# Patient Record
Sex: Male | Born: 1975
Health system: Southern US, Community
[De-identification: ages and names within clinical notes are randomized; demographics above are authoritative.]

## PROBLEM LIST (undated history)

## (undated) DIAGNOSIS — I10 Essential (primary) hypertension: Secondary | ICD-10-CM

## (undated) DIAGNOSIS — Z21 Asymptomatic human immunodeficiency virus [HIV] infection status: Secondary | ICD-10-CM

## (undated) DIAGNOSIS — B20 Human immunodeficiency virus [HIV] disease: Secondary | ICD-10-CM

## (undated) HISTORY — DX: Essential (primary) hypertension: I10

---

## 2017-04-12 ENCOUNTER — Encounter: Payer: Self-pay | Admitting: Podiatry

## 2017-04-12 ENCOUNTER — Ambulatory Visit (INDEPENDENT_AMBULATORY_CARE_PROVIDER_SITE_OTHER): Payer: BLUE CROSS/BLUE SHIELD | Admitting: Podiatry

## 2017-04-12 VITALS — BP 99/72 | HR 56 | Resp 16 | Ht 67.0 in | Wt 140.0 lb

## 2017-04-12 DIAGNOSIS — Q828 Other specified congenital malformations of skin: Secondary | ICD-10-CM

## 2017-04-12 DIAGNOSIS — L603 Nail dystrophy: Secondary | ICD-10-CM

## 2017-04-12 DIAGNOSIS — M2141 Flat foot [pes planus] (acquired), right foot: Secondary | ICD-10-CM

## 2017-04-12 DIAGNOSIS — M2142 Flat foot [pes planus] (acquired), left foot: Secondary | ICD-10-CM | POA: Diagnosis not present

## 2017-04-12 NOTE — Progress Notes (Signed)
   Subjective:    Patient ID: Charles Dalton, male    DOB: 11/23/1976, 41 y.o.   MRN: [id number]  HPI: He presents today chief complaint of pain to the left foot. He states that I think to have a wart beneath my fifth toe. He states his been going on now for about 10 years intrinsic down with a razor.  Review of Systems  HENT: Positive for sinus pain.   Eyes: Positive for itching.  Musculoskeletal: Positive for myalgias.  Neurological: Positive for numbness.  All other systems reviewed and are negative.      Objective:   Physical Exam: Vital signs are stable he is alert and oriented 3 pulses are palpable. He works at [employer]. He demonstrates mild pes planus set fifth metatarsal reactive hyperkeratotic lesion flexible pes planus radiographs demonstrate no major osseous abnormalities. No fractures are identified. Cutaneous evaluation only reactive hyperkeratosis no lesions noted. He does demonstrate reactive hyperkeratosis of fifth bilateral not just the left that he was complaining about. No signs of wart.        Assessment & Plan:  Plantar flexed fifth metatarsals with plantar fasciitis.  Plan: Debridement of reactive hyperkeratosis and he was scanned for several orthotics.

## 2017-05-05 ENCOUNTER — Ambulatory Visit: Payer: BLUE CROSS/BLUE SHIELD | Admitting: Podiatry

## 2020-12-19 ENCOUNTER — Ambulatory Visit: Payer: 59

## 2020-12-19 ENCOUNTER — Other Ambulatory Visit: Payer: Self-pay

## 2020-12-19 ENCOUNTER — Other Ambulatory Visit: Payer: 59

## 2020-12-19 ENCOUNTER — Other Ambulatory Visit (HOSPITAL_COMMUNITY)
Admission: RE | Admit: 2020-12-19 | Discharge: 2020-12-19 | Disposition: A | Discharge status: Home / Self Care | Payer: 59 | Source: Ambulatory Visit | Attending: Infectious Diseases | Admitting: Infectious Diseases

## 2020-12-19 DIAGNOSIS — Z113 Encounter for screening for infections with a predominantly sexual mode of transmission: Secondary | ICD-10-CM | POA: Diagnosis present

## 2020-12-19 DIAGNOSIS — Z79899 Other long term (current) drug therapy: Secondary | ICD-10-CM

## 2020-12-19 DIAGNOSIS — B20 Human immunodeficiency virus [HIV] disease: Secondary | ICD-10-CM

## 2020-12-19 NOTE — Addendum Note (Signed)
Addended by: Harley Alto on: 12/19/2020 12:36 PM   Modules accepted: Orders

## 2020-12-19 NOTE — Addendum Note (Signed)
Addended by: Linna Hoff D on: 12/19/2020 10:22 AM   Modules accepted: Orders

## 2020-12-20 LAB — URINALYSIS
Bilirubin Urine: NEGATIVE
Glucose, UA: NEGATIVE
Hgb urine dipstick: NEGATIVE
Ketones, ur: NEGATIVE
Leukocytes,Ua: NEGATIVE
Nitrite: NEGATIVE
Specific Gravity, Urine: 1.011 (ref 1.001–1.03)
pH: 5.5 (ref 5.0–8.0)

## 2020-12-22 LAB — URINE CYTOLOGY ANCILLARY ONLY
Chlamydia: NEGATIVE
Comment: NEGATIVE
Comment: NORMAL
Neisseria Gonorrhea: NEGATIVE

## 2020-12-26 LAB — CBC WITH DIFFERENTIAL/PLATELET
Absolute Monocytes: 532 cells/uL (ref 200–950)
Basophils Absolute: 42 cells/uL (ref 0–200)
Basophils Relative: 0.6 %
Eosinophils Absolute: 7 cells/uL — ABNORMAL LOW (ref 15–500)
Eosinophils Relative: 0.1 %
HCT: 38 % — ABNORMAL LOW (ref 38.5–50.0)
Hemoglobin: 12.9 g/dL — ABNORMAL LOW (ref 13.2–17.1)
Lymphs Abs: 2569 cells/uL (ref 850–3900)
MCH: 34.9 pg — ABNORMAL HIGH (ref 27.0–33.0)
MCHC: 33.9 g/dL (ref 32.0–36.0)
MCV: 102.7 fL — ABNORMAL HIGH (ref 80.0–100.0)
MPV: 11.9 fL (ref 7.5–12.5)
Monocytes Relative: 7.6 %
Neutro Abs: 3850 cells/uL (ref 1500–7800)
Neutrophils Relative %: 55 %
Platelets: 196 10*3/uL (ref 140–400)
RBC: 3.7 10*6/uL — ABNORMAL LOW (ref 4.20–5.80)
RDW: 12.2 % (ref 11.0–15.0)
Total Lymphocyte: 36.7 %
WBC: 7 10*3/uL (ref 3.8–10.8)

## 2020-12-26 LAB — HEPATITIS B CORE ANTIBODY, TOTAL: Hep B Core Total Ab: NONREACTIVE

## 2020-12-26 LAB — HIV-1/2 AB - DIFFERENTIATION
HIV-1 antibody: POSITIVE — AB
HIV-2 Ab: NEGATIVE

## 2020-12-26 LAB — HEPATITIS B SURFACE ANTIBODY,QUALITATIVE: Hep B S Ab: NONREACTIVE

## 2020-12-26 LAB — COMPLETE METABOLIC PANEL WITH GFR
AG Ratio: 1.8 (calc) (ref 1.0–2.5)
ALT: 61 U/L — ABNORMAL HIGH (ref 9–46)
AST: 74 U/L — ABNORMAL HIGH (ref 10–40)
Albumin: 4.8 g/dL (ref 3.6–5.1)
Alkaline phosphatase (APISO): 106 U/L (ref 36–130)
BUN: 11 mg/dL (ref 7–25)
CO2: 23 mmol/L (ref 20–32)
Calcium: 9.4 mg/dL (ref 8.6–10.3)
Chloride: 104 mmol/L (ref 98–110)
Creat: 0.92 mg/dL (ref 0.60–1.35)
GFR, Est African American: 117 mL/min/{1.73_m2} (ref >=60)
GFR, Est Non African American: 101 mL/min/{1.73_m2} (ref >=60)
Globulin: 2.6 g/dL (calc) (ref 1.9–3.7)
Glucose, Bld: 96 mg/dL (ref 65–99)
Potassium: 3.3 mmol/L — ABNORMAL LOW (ref 3.5–5.3)
Sodium: 142 mmol/L (ref 135–146)
Total Bilirubin: 0.5 mg/dL (ref 0.2–1.2)
Total Protein: 7.4 g/dL (ref 6.1–8.1)

## 2020-12-26 LAB — QUANTIFERON-TB GOLD PLUS
Mitogen-NIL: >10 IU/mL
NIL: 0.07 IU/mL
QuantiFERON-TB Gold Plus: NEGATIVE
TB1-NIL: <0 IU/mL
TB2-NIL: <0 IU/mL

## 2020-12-26 LAB — T-HELPER CELLS (CD4) COUNT (NOT AT ARMC)
Absolute CD4: 1332 cells/uL (ref 490–1740)
CD4 T Helper %: 44 % (ref 30–61)
Total lymphocyte count: 3015 cells/uL (ref 850–3900)

## 2020-12-26 LAB — HIV-1 RNA ULTRAQUANT REFLEX TO GENTYP+
HIV 1 RNA Quant: <20 copies/mL
HIV-1 RNA Quant, Log: <1.3 Log copies/mL

## 2020-12-26 LAB — HEPATITIS A ANTIBODY, TOTAL: Hepatitis A AB,Total: NONREACTIVE

## 2020-12-26 LAB — LIPID PANEL
Cholesterol: 150 mg/dL (ref <200)
HDL: 89 mg/dL (ref >=40)
LDL Cholesterol (Calc): 41 mg/dL (calc)
Non-HDL Cholesterol (Calc): 61 mg/dL (calc) (ref <130)
Total CHOL/HDL Ratio: 1.7 (calc) (ref <5.0)
Triglycerides: 112 mg/dL (ref <150)

## 2020-12-26 LAB — RPR: RPR Ser Ql: NONREACTIVE

## 2020-12-26 LAB — HEPATITIS C ANTIBODY
Hepatitis C Ab: NONREACTIVE
SIGNAL TO CUT-OFF: 0.03 (ref <1.00)

## 2020-12-26 LAB — HIV ANTIBODY (ROUTINE TESTING W REFLEX): HIV 1&2 Ab, 4th Generation: REACTIVE — AB

## 2020-12-26 LAB — HEPATITIS B SURFACE ANTIGEN: Hepatitis B Surface Ag: NONREACTIVE

## 2020-12-26 LAB — HLA B*5701: HLA-B*5701 w/rflx HLA-B High: NEGATIVE

## 2021-01-02 ENCOUNTER — Other Ambulatory Visit: Payer: Self-pay | Admitting: Infectious Diseases

## 2021-01-02 ENCOUNTER — Ambulatory Visit (INDEPENDENT_AMBULATORY_CARE_PROVIDER_SITE_OTHER): Payer: 59 | Admitting: Infectious Diseases

## 2021-01-02 ENCOUNTER — Telehealth: Payer: Self-pay

## 2021-01-02 ENCOUNTER — Encounter: Payer: Self-pay | Admitting: Infectious Diseases

## 2021-01-02 ENCOUNTER — Other Ambulatory Visit: Payer: Self-pay

## 2021-01-02 VITALS — BP 161/102 | HR 87 | Temp 99.0°F | Wt 136.2 lb

## 2021-01-02 DIAGNOSIS — Z23 Encounter for immunization: Secondary | ICD-10-CM | POA: Diagnosis not present

## 2021-01-02 DIAGNOSIS — Z113 Encounter for screening for infections with a predominantly sexual mode of transmission: Secondary | ICD-10-CM | POA: Diagnosis not present

## 2021-01-02 DIAGNOSIS — Z1211 Encounter for screening for malignant neoplasm of colon: Secondary | ICD-10-CM | POA: Insufficient documentation

## 2021-01-02 DIAGNOSIS — Z7289 Other problems related to lifestyle: Secondary | ICD-10-CM | POA: Diagnosis not present

## 2021-01-02 DIAGNOSIS — Z789 Other specified health status: Secondary | ICD-10-CM

## 2021-01-02 DIAGNOSIS — B20 Human immunodeficiency virus [HIV] disease: Secondary | ICD-10-CM | POA: Diagnosis not present

## 2021-01-02 DIAGNOSIS — F17200 Nicotine dependence, unspecified, uncomplicated: Secondary | ICD-10-CM | POA: Insufficient documentation

## 2021-01-02 MED ORDER — BICTEGRAVIR-EMTRICITAB-TENOFOV 50-200-25 MG PO TABS: 1.0000 | ORAL_TABLET | Freq: Every day | ORAL | 5 refills | Status: DC

## 2021-01-02 MED FILL — BIKTARVY 50-200-25 MG TABS: 50-200-25 | 30 days supply | Qty: 30 | Fill #0

## 2021-01-02 NOTE — Assessment & Plan Note (Signed)
Counseled

## 2021-01-02 NOTE — Telephone Encounter (Signed)
[facility] Patient Product/process development scientist completed.    The patient is insured through [facility] and has a $250.00 copay.  Patient will need a Co-Pay Coupon Card.  We will continue to follow to see if copay assistance is needed.  Clearance Coots, CPhT Specialty Pharmacy Patient [facility] for Infectious Disease Phone: [phone] Fax:  [phone]

## 2021-01-02 NOTE — Assessment & Plan Note (Signed)
counseled

## 2021-01-02 NOTE — Assessment & Plan Note (Signed)
Recent Urine GC and RPR negative  

## 2021-01-02 NOTE — Progress Notes (Signed)
[address], [place], [place], [address]                                                                  Phn. [phone]; Fax: [phone]                                                                             Date: 01/02/21  Reason for Visit: HIV Transferring Care    HPI: Charles Dalton is a 45 y.o.old male with a history of HIV who is transferring care to [facility].  Patient is reluctant to provide his past medical history of HIV.  He tells me that he just found out he had HIV. He was referred to me from [facility] department with no other information except positive result of HIV 1 in 09/12/2010.  He also tells me that he has never been treated for HIV and never seen a provider. On further probing, he told me he [facility] but he has not ever taken any antiretroviral treatment.  I do not have his outside facility records but it seems he was seen at [facility] by Dr. Currie Paris in 02/15/2014 per HD notes.  I have asked my staff to request medical records from there.  He denies having any other medical problems to me.  Denies any history of sexually transmitted diseases like syphilis, gonorrhea and Chlamydia  Smokes a pack of cigarettes a day, 5 times a week drinks liquor. Smokes weed everyday  Work in an Research scientist (life sciences) part store Lives in an apartment by himself  Denies being currently sexually active currently but had male sexual partners in the past.  Has traveled to [place] and [place]. He says he has traveled a lot of states in the [place].   He says he has got 2 doses of Covid vaccine and is willing to get the Covid booster and flu vaccine.  He is interested to start treatment for HIV.  He has on and off loose  Stools going on for the last 3  months which he describes as 2-3 times a day, sometimes watery and sometimes solid which started after having a barbecue. Denies associated fever, chills, N/V and abdominal pain. Denies recent travel, sick contacts, new medications.  Appetite is good. No changes in weight. No other complaints   ROS: Denies dysphagia, odynophagia, cough, fever, nausea, vomiting, diarrhea, constipation, weight loss, chills, night sweats, recent hospitalizations, rashes, joint complaints, shortness of breath, headaches, chest pain, abdominal pain, dysuria .  Current Outpatient Medications on File Prior to Visit  Medication Sig Dispense Refill  .  CHANTIX STARTING MONTH PAK 0.5 MG X 11 & 1 MG X 42 tablet See admin instructions.  3  . naproxen (NAPROSYN) 500 MG tablet TAKE 1 TAB W/ BREAKFAST AND DINNER X 2 WEEKS  0   No current facility-administered medications on file prior to visit.     No Known Allergies  PMH: No PSH: No   Social History   Socioeconomic History  . Marital status: Single    Spouse name: Not on file  . Number of children: Not on file  . Years of education: Not on file  . Highest education level: Not on file  Occupational History  . Not on file  Tobacco Use  . Smoking status: Current Every Day Smoker  . Smokeless tobacco: Never Used  Substance and Sexual Activity  . Alcohol use: Not on file  . Drug use: Not on file  . Sexual activity: Not on file  Other Topics Concern  . Not on file  Social History Narrative  . Not on file   Social Determinants of Health   Financial Resource Strain: Not on file  Food Insecurity: Not on file  Transportation Needs: Not on file  Physical Activity: Not on file  Stress: Not on file  Social Connections: Not on file  Intimate Partner Violence: Not on file    Vitals  BP (!) 161/102   Pulse 87   Temp 99 F (37.2 C) (Oral)   Wt 136 lb 3.2 oz (61.8 kg)   BMI 21.33 kg/m   Examination  Gen: Alert and oriented x 3, no acute distress HEENT:  [place]/AT, PERL, no scleral icterus, no pale conjunctivae, hearing normal, oral mucosa moist, NO ORAL CANDIDIASIS Neck: Supple, no lymphadenopathy Cardio: Regular rate and rhythm; +S1 and S2; no murmurs, gallops, or rubs Resp: CTAB; no wheezes, rhonchi, or rales GI: Soft, nontender, nondistended, bowel sounds present GU: Musc: Extremities: No cyanosis, clubbing, or edema; +2 PT and DP pulses Skin: DRY SKIN  Neuro: No focal deficits Psych: Calm, cooperative    Lab Results HIV 1 RNA Quant (copies/mL)  Date Value  12/19/2020 <20 NOT DETECTED   No results found for: HIV1GENOSEQ Lab Results  Component Value Date   WBC 7.0 12/19/2020   HGB 12.9 (L) 12/19/2020   HCT 38.0 (L) 12/19/2020   MCV 102.7 (H) 12/19/2020   PLT 196 12/19/2020    Lab Results  Component Value Date   CREATININE 0.92 12/19/2020   BUN 11 12/19/2020   NA 142 12/19/2020   K 3.3 (L) 12/19/2020   CL 104 12/19/2020   CO2 23 12/19/2020   Lab Results  Component Value Date   ALT 61 (H) 12/19/2020   AST 74 (H) 12/19/2020   BILITOT 0.5 12/19/2020    Lab Results  Component Value Date   CHOL 150 12/19/2020   TRIG 112 12/19/2020   HDL 89 12/19/2020   LDLCALC 41 12/19/2020   Lab Results  Component Value Date   HAV NON-REACTIVE 12/19/2020   Lab Results  Component Value Date   HEPBSAG NON-REACTIVE 12/19/2020   HEPBSAB NON-REACTIVE 12/19/2020   No results found for: HCVAB Lab Results  Component Value Date   CHLAMYDIAWP Negative 12/19/2020   N Negative 12/19/2020    Health Maintenance:  There is no immunization history on file for this patient.  Assessment/Plan: HIV Discussed with patient treatment options and side effects, benefits of treatment, long term outcomes.  Discussed the severity of untreated HIV including higher cancer risk, opportunistic infections, renal failure.  Discussed needing to use condoms, partner disclosure, necessary vaccines, blood monitoring.     Start Biktarvy 1 tab po daily,  discussed about instruction on how to take and side effects Fu in 2 -3 months  Will request medical records from Outside facility   Loose stools  Do not suspect OI given HIV is well controlled No recent antibiotics  Will monitor for now   Smoking/Alcohol Counseled  STD Screening Recent Urine GC and RPR negative   Immunization Discussed recommended vaccines in [facility]  Flu and COVID Booster today  I have personally spent 60 minutes involved in face-to-face and non-face-to-face activities for this patient on the day of the visit. Professional time spent includes the following activities, in addition to those noted in the documentation: Preparing to see the patient (review of tests), Obtaining and/or reviewing separately obtained history (admission/discharge record), Performing a medically appropriate examination and/or evaluation , Ordering medications/tests/procedures, referring and communicating with other health care professionals, Documenting clinical information in the EMR or other health record, Independently interpreting results (not separately reported), Communicating results to the patient/family/caregiver, Counseling and educating the patient/family/caregiver and Care coordination (not separately reported).    Patient's labs were reviewed as well as his previous records. Patients questions were addressed and answered. Safe sex counseling done.   Electronically signed by:  Odette Fraction, MD Infectious Disease Physician [facility] for Infectious Disease [address] [place], [place] [address] Phone: [phone]  Fax: [phone]

## 2021-01-02 NOTE — Assessment & Plan Note (Signed)
COVID Booster and Flu vaccine today

## 2021-01-02 NOTE — Telephone Encounter (Signed)
[facility] Patient Advocate Encounter °  °Was successful in obtaining a [employer] copay card for Biktarvy.  This copay card will make the patients copay 0.00. ° °I have spoken with the patient.   ° °The billing information is as follows and has been shared with [facility]. ° ° ° ° ° ° ° °Charles Dalton, CPhT °Specialty Pharmacy Patient Advocate °[facility] °Phone: [phone] °Fax:  [phone]  °

## 2021-01-05 NOTE — Progress Notes (Signed)
   Covid-19 Vaccination Clinic  Name:  Charles Dalton    MRN: [id number] DOB: September 23, 1976  01/05/2021  Charles Dalton was observed post Covid-19 immunization for 15 minutes without incident. He was provided with Vaccine Information Sheet and instruction to access the V-Safe system.   Charles Dalton was instructed to call 911 with any severe reactions post vaccine: Marland Kitchen Difficulty breathing  . Swelling of face and throat  . A fast heartbeat  . A bad rash all over body  . Dizziness and weakness

## 2021-01-30 ENCOUNTER — Encounter: Payer: Self-pay | Admitting: Infectious Diseases

## 2021-02-02 MED FILL — BIKTARVY 50-200-25 MG TABS: 50-200-25 | 30 days supply | Qty: 30 | Fill #1

## 2021-02-24 ENCOUNTER — Other Ambulatory Visit (HOSPITAL_COMMUNITY): Payer: Self-pay

## 2021-02-27 ENCOUNTER — Other Ambulatory Visit: Payer: Self-pay

## 2021-02-27 ENCOUNTER — Other Ambulatory Visit (HOSPITAL_COMMUNITY)
Admission: RE | Admit: 2021-02-27 | Discharge: 2021-02-27 | Disposition: A | Discharge status: Home / Self Care | Payer: 59 | Source: Ambulatory Visit | Attending: Infectious Diseases | Admitting: Infectious Diseases

## 2021-02-27 ENCOUNTER — Ambulatory Visit (INDEPENDENT_AMBULATORY_CARE_PROVIDER_SITE_OTHER): Payer: 59 | Admitting: Infectious Diseases

## 2021-02-27 ENCOUNTER — Encounter: Payer: Self-pay | Admitting: Infectious Diseases

## 2021-02-27 VITALS — BP 164/96 | HR 76 | Wt 137.0 lb

## 2021-02-27 DIAGNOSIS — R7401 Elevation of levels of liver transaminase levels: Secondary | ICD-10-CM

## 2021-02-27 DIAGNOSIS — F17200 Nicotine dependence, unspecified, uncomplicated: Secondary | ICD-10-CM

## 2021-02-27 DIAGNOSIS — B20 Human immunodeficiency virus [HIV] disease: Secondary | ICD-10-CM

## 2021-02-27 DIAGNOSIS — R197 Diarrhea, unspecified: Secondary | ICD-10-CM

## 2021-02-27 DIAGNOSIS — Z113 Encounter for screening for infections with a predominantly sexual mode of transmission: Secondary | ICD-10-CM | POA: Insufficient documentation

## 2021-02-27 MED ORDER — NICOTINE 7 MG/24HR TD PT24
7.0000 mg | MEDICATED_PATCH | Freq: Every day | TRANSDERMAL | 0 refills | Status: DC
  Filled 2021-03-25: qty 28, 28d supply, fill #0

## 2021-02-27 NOTE — Progress Notes (Signed)
[address], [place], [place], [address]                                                                  Phn. [phone]; Fax: [phone]                                                                             Date: 02/27/21  Reason for Visit: HIV follow up   HPI: Charles Dalton is a 45 y.o.old male with a history of HIV who is here for follow-up.  He is taking Biktarvy and endorses missing some doses, possibly less than 10 since last clinic visit.  Denies any side effects or barriers to adherence of treatment.  He complains of ongoing diarrhea, watery stool every time he eats and he thinks it has been going on for 6 months at least.  Denies any blood in the stool.  Denies any nausea vomiting or abdominal pain.  Denies any fever chills and sweats.  Denies any rashes or joint pain.  Denies any recent travel or new medicine intake.  No loss of appetite or loss of weight.  Denies being sexually active.  Smokes cigarettes and marijuana, counseled on quitting.  Denies alcohol or IV drug use.  He is working at [employer].  Denies any feelings of hopelessness sadness or depression.  He has a regular dentist with him whom he follows up.  He declined getting vaccinated for hepatitis A and B.  He is willing to get screened for STDs.  ROS: Denies dysphagia, odynophagia, cough, fever, nausea, vomiting,  constipation, weight loss, chills, night sweats, recent hospitalizations, rashes, joint complaints, shortness of breath, headaches, chest pain, abdominal pain, dysuria .  Current Outpatient Medications on File Prior to Visit  Medication Sig Dispense Refill  . bictegravir-emtricitabine-tenofovir AF (BIKTARVY) 50-200-25 MG TABS tablet Take 1 tablet by mouth daily. 30 tablet 5  . naproxen (NAPROSYN) 500 MG  tablet TAKE 1 TAB W/ BREAKFAST AND DINNER X 2 WEEKS  0   No current facility-administered medications on file prior to visit.                          No Known Allergies   Social History   Socioeconomic History  . Marital status: Single    Spouse name: Not on file  . Number of children: Not on file  . Years of education: Not on file  . Highest education level: Not on file  Occupational History  . Not on file  Tobacco Use  . Smoking status: Current Every Day Smoker  . Smokeless tobacco: Never Used  Substance and Sexual Activity  . Alcohol use: Not on file  . Drug use: Not on file  . Sexual activity: Not on file  Other Topics Concern  . Not on file  Social History Narrative  . Not on file   Social Determinants of Health   Financial Resource Strain: Not on file  Food Insecurity: Not on file  Transportation Needs: Not on file  Physical Activity: Not on file  Stress: Not on file  Social Connections: Not on file  Intimate Partner Violence: Not on file     Vitals  BP (!) 164/96   Pulse 76   Wt 137 lb (62.1 kg)   BMI 21.46 kg/m    Examination  Gen: Alert and oriented x 3, no acute distress HEENT: [place]/AT, PERL, EOMI, no scleral icterus, no pale conjunctivae, hearing normal, oral mucosa moist Neck: Supple, no lymphadenopathy Cardio: Regular rate and rhythm; +S1 and S2; no murmurs, gallops, or rubs Resp: CTAB; no wheezes, rhonchi, or rales GI: Soft, nontender, nondistended, bowel sounds present GU: Musc: Extremities: No cyanosis, clubbing, or edema; +2 PT and DP pulses Skin: No rashes, lesions, or ecchymoses Neuro: No focal deficits Psych: Calm, cooperative  Lab Results HIV 1 RNA Quant (copies/mL)  Date Value  12/19/2020 <20 NOT DETECTED   No results found for: HIV1GENOSEQ Lab Results  Component Value Date   WBC 7.0 12/19/2020   HGB 12.9 (L) 12/19/2020   HCT 38.0 (L) 12/19/2020   MCV 102.7 (H) 12/19/2020   PLT 196 12/19/2020    Lab Results  Component  Value Date   CREATININE 0.92 12/19/2020   BUN 11 12/19/2020   NA 142 12/19/2020   K 3.3 (L) 12/19/2020   CL 104 12/19/2020   CO2 23 12/19/2020   Lab Results  Component Value Date   ALT 61 (H) 12/19/2020   AST 74 (H) 12/19/2020   BILITOT 0.5 12/19/2020    Lab Results  Component Value Date   CHOL 150 12/19/2020   TRIG 112 12/19/2020   HDL 89 12/19/2020   LDLCALC 41 12/19/2020   Lab Results  Component Value Date   HAV NON-REACTIVE 12/19/2020   Lab Results  Component Value Date   HEPBSAG NON-REACTIVE 12/19/2020   HEPBSAB NON-REACTIVE 12/19/2020   No results found for: HCVAB Lab Results  Component Value Date   CHLAMYDIAWP Negative 12/19/2020   N Negative 12/19/2020   No results found for: GCPROBEAPT No results found for: QUANTGOLD   Health Maintenance: Immunization History  Administered Date(s) Administered  . Influenza,inj,Quad PF,6+ Mos 01/02/2021  . Moderna Sars-Covid-2 Vaccination 02/11/2020, 03/10/2020  . PFIZER Comirnaty(Gray Top)Covid-19 Tri-Sucrose Vaccine 01/02/2021   Assessment/Plan: Problem List Items Addressed This Visit      Other   HIV disease (HCC) - Primary   Relevant Orders   HIV-1 RNA ultraquant reflex to gentyp+   Smoking    Other Visit Diagnoses    Screening for venereal disease       Relevant Orders   Urine cytology ancillary only   RPR   Diarrhea, unspecified type       Relevant Orders   Gastrointestinal Pathogen Panel PCR   C. difficile GDH and Toxin A/B   Transaminitis       Relevant Orders   Hepatic function panel      HIV Continue Biktarvy  Counseled on adherence Fu in 3 months       Chronic Diarrhea Will get GI PCR panel and C diff Low suspicion for OIs given  well controlled HIV Will consider referral to GI if above work up no revealing   Transaminitis Hepatic panel today   Smoking Counseled He would like to get a refill on nicotine patch  STD Screening Urine GC and RPR  Immunization Refused Vaccines  today He will consider HepA or Hep B vaccine next time  Patient's labs were reviewed as well as his previous records. Patients questions were addressed and answered. Safe sex counseling done.  I spent approx 30  minute reviewing data/chart, and coordinating care and >50% direct face to face time providing counseling/discussing diagnostics/treatment plan with patient  Electronically signed by:  Odette Fraction, MD Infectious Disease Physician [facility] for Infectious Disease [address] [place], [place] [address] Phone: [phone]  Fax: [phone]

## 2021-02-28 ENCOUNTER — Other Ambulatory Visit (HOSPITAL_COMMUNITY): Payer: Self-pay

## 2021-03-01 LAB — URINE CYTOLOGY ANCILLARY ONLY
Chlamydia: NEGATIVE
Comment: NEGATIVE
Comment: NORMAL
Neisseria Gonorrhea: NEGATIVE

## 2021-03-02 ENCOUNTER — Other Ambulatory Visit: Payer: Self-pay | Admitting: Infectious Diseases

## 2021-03-02 ENCOUNTER — Telehealth: Payer: Self-pay

## 2021-03-02 DIAGNOSIS — R7401 Elevation of levels of liver transaminase levels: Secondary | ICD-10-CM

## 2021-03-02 NOTE — Telephone Encounter (Signed)
Spoke with patient about elevated liver enzymes. Patient reports drinking liquor ("couple of shots) 3-4 times a week. Informed him that provider would like to get an ultrasound of his liver.   Dr. Elinor Parkinson, please place referral for [place] and I'll have Claris Che follow up.   Thanks! Afomia Blackley Loyola Mast, RN

## 2021-03-02 NOTE — Telephone Encounter (Signed)
-----   Message from Odette Fraction, MD sent at 03/02/2021  8:02 AM EDT ----- Please let him know that his liver enzymes are somewhat elevated. Please check if he consumes alcohol. If yes, what is the frequency? He complained me of ongoing diarrhea in the clinic. The elevated liver enzymes may be related to his diarrhea. I would like to get an Ultrasound of his liver.

## 2021-03-02 NOTE — Telephone Encounter (Signed)
Ok, Will do!

## 2021-03-02 NOTE — Progress Notes (Signed)
Please let him know that his liver enzymes are somewhat elevated. Please check if he consumes alcohol. If yes, what is the frequency? He complained me of ongoing diarrhea in the clinic. The elevated liver enzymes may be related to his diarrhea. I would like to get an Ultrasound of his liver.

## 2021-03-04 ENCOUNTER — Other Ambulatory Visit (HOSPITAL_COMMUNITY): Payer: Self-pay

## 2021-03-04 MED FILL — Bictegravir-Emtricitabine-Tenofovir AF Tab 50-200-25 MG: ORAL | 30 days supply | Qty: 30 | Fill #0 | Status: AC

## 2021-03-05 ENCOUNTER — Other Ambulatory Visit (HOSPITAL_COMMUNITY): Payer: Self-pay

## 2021-03-16 LAB — HEPATIC FUNCTION PANEL
AG Ratio: 1.7 (calc) (ref 1.0–2.5)
ALT: 74 U/L — ABNORMAL HIGH (ref 9–46)
AST: 113 U/L — ABNORMAL HIGH (ref 10–40)
Albumin: 4.6 g/dL (ref 3.6–5.1)
Alkaline phosphatase (APISO): 95 U/L (ref 36–130)
Bilirubin, Direct: 0.1 mg/dL (ref 0.0–0.2)
Globulin: 2.7 g/dL (calc) (ref 1.9–3.7)
Indirect Bilirubin: 0.7 mg/dL (calc) (ref 0.2–1.2)
Total Bilirubin: 0.8 mg/dL (ref 0.2–1.2)
Total Protein: 7.3 g/dL (ref 6.1–8.1)

## 2021-03-16 LAB — HIV-1 RNA ULTRAQUANT REFLEX TO GENTYP+
HIV 1 RNA Quant: <20 copies/mL — AB
HIV-1 RNA Quant, Log: <1.3 Log copies/mL — AB

## 2021-03-16 LAB — RPR: RPR Ser Ql: NONREACTIVE

## 2021-03-25 ENCOUNTER — Other Ambulatory Visit (HOSPITAL_COMMUNITY): Payer: Self-pay

## 2021-03-25 MED FILL — Bictegravir-Emtricitabine-Tenofovir AF Tab 50-200-25 MG: ORAL | 30 days supply | Qty: 30 | Fill #1 | Status: AC

## 2021-04-02 ENCOUNTER — Other Ambulatory Visit (HOSPITAL_COMMUNITY): Payer: Self-pay

## 2021-05-04 ENCOUNTER — Other Ambulatory Visit (HOSPITAL_COMMUNITY): Payer: Self-pay

## 2021-05-04 MED FILL — Bictegravir-Emtricitabine-Tenofovir AF Tab 50-200-25 MG: ORAL | 30 days supply | Qty: 30 | Fill #2 | Status: AC

## 2021-05-05 ENCOUNTER — Other Ambulatory Visit (HOSPITAL_COMMUNITY): Payer: Self-pay

## 2021-05-22 ENCOUNTER — Other Ambulatory Visit: Payer: 59

## 2021-05-22 ENCOUNTER — Other Ambulatory Visit: Payer: Self-pay

## 2021-05-22 DIAGNOSIS — B20 Human immunodeficiency virus [HIV] disease: Secondary | ICD-10-CM

## 2021-05-25 LAB — CBC WITH DIFFERENTIAL/PLATELET
Absolute Monocytes: 583 cells/uL (ref 200–950)
Basophils Absolute: 39 cells/uL (ref 0–200)
Basophils Relative: 0.8 %
Eosinophils Absolute: 10 cells/uL — ABNORMAL LOW (ref 15–500)
Eosinophils Relative: 0.2 %
HCT: 41.7 % (ref 38.5–50.0)
Hemoglobin: 14.1 g/dL (ref 13.2–17.1)
Lymphs Abs: 2671 cells/uL (ref 850–3900)
MCH: 35.3 pg — ABNORMAL HIGH (ref 27.0–33.0)
MCHC: 33.8 g/dL (ref 32.0–36.0)
MCV: 104.5 fL — ABNORMAL HIGH (ref 80.0–100.0)
MPV: 11.1 fL (ref 7.5–12.5)
Monocytes Relative: 11.9 %
Neutro Abs: 1597 cells/uL (ref 1500–7800)
Neutrophils Relative %: 32.6 %
Platelets: 218 10*3/uL (ref 140–400)
RBC: 3.99 10*6/uL — ABNORMAL LOW (ref 4.20–5.80)
RDW: 11.9 % (ref 11.0–15.0)
Total Lymphocyte: 54.5 %
WBC: 4.9 10*3/uL (ref 3.8–10.8)

## 2021-05-25 LAB — COMPLETE METABOLIC PANEL WITH GFR
AG Ratio: 1.6 (calc) (ref 1.0–2.5)
ALT: 75 U/L — ABNORMAL HIGH (ref 9–46)
AST: 83 U/L — ABNORMAL HIGH (ref 10–40)
Albumin: 4.5 g/dL (ref 3.6–5.1)
Alkaline phosphatase (APISO): 100 U/L (ref 36–130)
BUN: 18 mg/dL (ref 7–25)
CO2: 28 mmol/L (ref 20–32)
Calcium: 8.9 mg/dL (ref 8.6–10.3)
Chloride: 105 mmol/L (ref 98–110)
Creat: 1.01 mg/dL (ref 0.60–1.35)
GFR, Est African American: 104 mL/min/{1.73_m2} (ref >=60)
GFR, Est Non African American: 90 mL/min/{1.73_m2} (ref >=60)
Globulin: 2.8 g/dL (calc) (ref 1.9–3.7)
Glucose, Bld: 91 mg/dL (ref 65–99)
Potassium: 4.1 mmol/L (ref 3.5–5.3)
Sodium: 141 mmol/L (ref 135–146)
Total Bilirubin: 0.5 mg/dL (ref 0.2–1.2)
Total Protein: 7.3 g/dL (ref 6.1–8.1)

## 2021-05-25 LAB — HIV-1 RNA QUANT-NO REFLEX-BLD
HIV 1 RNA Quant: <20 Copies/mL — ABNORMAL HIGH
HIV-1 RNA Quant, Log: <1.3 Log cps/mL — ABNORMAL HIGH

## 2021-05-25 LAB — T-HELPER CELLS (CD4) COUNT (NOT AT ARMC)
Absolute CD4: 1279 cells/uL (ref 490–1740)
CD4 T Helper %: 46 % (ref 30–61)
Total lymphocyte count: 2784 cells/uL (ref 850–3900)

## 2021-05-28 ENCOUNTER — Other Ambulatory Visit (HOSPITAL_COMMUNITY): Payer: Self-pay

## 2021-05-28 MED FILL — Bictegravir-Emtricitabine-Tenofovir AF Tab 50-200-25 MG: ORAL | 30 days supply | Qty: 30 | Fill #3 | Status: AC

## 2021-06-04 ENCOUNTER — Other Ambulatory Visit (HOSPITAL_COMMUNITY): Payer: Self-pay

## 2021-06-05 ENCOUNTER — Ambulatory Visit (INDEPENDENT_AMBULATORY_CARE_PROVIDER_SITE_OTHER): Payer: 59 | Admitting: Infectious Diseases

## 2021-06-05 ENCOUNTER — Encounter: Payer: Self-pay | Admitting: Infectious Diseases

## 2021-06-05 ENCOUNTER — Other Ambulatory Visit: Payer: Self-pay

## 2021-06-05 VITALS — BP 138/88 | HR 99 | Temp 98.9°F | Ht 69.0 in | Wt 132.6 lb

## 2021-06-05 DIAGNOSIS — F17200 Nicotine dependence, unspecified, uncomplicated: Secondary | ICD-10-CM | POA: Diagnosis not present

## 2021-06-05 DIAGNOSIS — R7401 Elevation of levels of liver transaminase levels: Secondary | ICD-10-CM | POA: Diagnosis not present

## 2021-06-05 DIAGNOSIS — Z23 Encounter for immunization: Secondary | ICD-10-CM

## 2021-06-05 DIAGNOSIS — B20 Human immunodeficiency virus [HIV] disease: Secondary | ICD-10-CM | POA: Diagnosis not present

## 2021-06-05 DIAGNOSIS — Z789 Other specified health status: Secondary | ICD-10-CM

## 2021-06-05 DIAGNOSIS — R197 Diarrhea, unspecified: Secondary | ICD-10-CM

## 2021-06-05 DIAGNOSIS — Z7289 Other problems related to lifestyle: Secondary | ICD-10-CM

## 2021-06-05 MED ORDER — BICTEGRAVIR-EMTRICITAB-TENOFOV 50-200-25 MG PO TABS: 1.0000 | ORAL_TABLET | Freq: Every day | ORAL | 5 refills | Status: DC

## 2021-06-05 NOTE — Progress Notes (Addendum)
[address], [place], [place], [address]                                                                  Phn. [phone]; Fax: [phone]                                                                             Date: 06/05/21  Reason for Visit: HIV follow up   HPI: Charles Dalton is a 45 y.o.old male with a history of HIV who is here for follow-up.    Taking Biktarvy, missed around 5 doses. Discussed about compliance like taking ART in the same time of the day in order not to forget it. He still has on going diarrhea which he says is on and off. He thinks it has been present for at least 6 or more months. Every time he eats, he gets loose stool. Denies any blood in stool. Denies nausea/vomiting and abdominal pain.  He thinks it might be related to alcohol; drinks brandy 3-4 shots in the evening. Appetite is not good, thinks he has lost weight but seems to have been more or less stable since Feb 2022. Denies fevers, chills and sweats. Smokes 1 pack of cigarettes a day, smokes marijuana. Declined vaccines. Declined STD screening stating he does not think he has it. Denies being sexually active. Declined referral to GI saying he does not want to get a huge bills. Says he has his dentist with who he follows up.   ROS: Denies dysphagia, odynophagia, cough, fever, nausea, vomiting,  constipation, weight loss, chills, night sweats, recent hospitalizations, rashes, joint complaints, shortness of breath, headaches, chest pain, abdominal pain, dysuria .  Current Outpatient Medications on File Prior to Visit  Medication Sig Dispense Refill   bictegravir-emtricitabine-tenofovir AF (BIKTARVY) 50-200-25 MG TABS tablet TAKE 1 TABLET BY MOUTH DAILY. 30 tablet 5   naproxen (NAPROSYN) 500 MG tablet TAKE 1 TAB W/  BREAKFAST AND DINNER X 2 WEEKS (Patient not taking: Reported on 06/05/2021)  0   nicotine (NICODERM CQ - DOSED IN MG/24 HR) 7 mg/24hr patch Place 1 patch (7 mg total) onto the skin daily. (Patient not taking: Reported on 06/05/2021) 28 patch 0   No current facility-administered medications on file prior to visit.                          No Known Allergies   Social History   Socioeconomic History   Marital status: Single    Spouse name: Not on file   Number of children: Not on file   Years of education:  Not on file   Highest education level: Not on file  Occupational History   Not on file  Tobacco Use   Smoking status: Every Day    Pack years: 0.00   Smokeless tobacco: Never  Substance and Sexual Activity   Alcohol use: Not on file   Drug use: Not on file   Sexual activity: Not on file  Other Topics Concern   Not on file  Social History Narrative   Not on file   Social Determinants of Health   Financial Resource Strain: Not on file  Food Insecurity: Not on file  Transportation Needs: Not on file  Physical Activity: Not on file  Stress: Not on file  Social Connections: Not on file  Intimate Partner Violence: Not on file     Vitals  BP 138/88   Pulse 99   Temp 98.9 F (37.2 C) (Oral)   Ht 5\' 9"  (1.753 m)   Wt 132 lb 9.6 oz (60.1 kg)   SpO2 95%   BMI 19.58 kg/m    Examination  Gen: Alert and oriented x 3, no acute distress HEENT: no scleral icterus, no pale conjunctivae, hearing normal, oral mucosa moist Neck: Supple, no lymphadenopathy Cardio: Regular rate and rhythm; +S1 and S2; no murmurs, gallops, or rubs Resp: CTAB; no wheezes, rhonchi, or rales GI: Soft, nontender, nondistended, bowel sounds present GU: Musc: Extremities: No cyanosis, clubbing, or edema; +2 PT and DP pulses Skin: No rashes, lesions, or ecchymoses Neuro: No focal deficits Psych: Calm, cooperative  Lab Results HIV 1 RNA Quant  Date Value  05/22/2021 <20 Copies/mL (H)  02/27/2021  <20 DETECTED copies/mL (A)  12/19/2020 <20 NOT DETECTED copies/mL   No results found for: HIV1GENOSEQ Lab Results  Component Value Date   WBC 4.9 05/22/2021   HGB 14.1 05/22/2021   HCT 41.7 05/22/2021   MCV 104.5 (H) 05/22/2021   PLT 218 05/22/2021    Lab Results  Component Value Date   CREATININE 1.01 05/22/2021   BUN 18 05/22/2021   NA 141 05/22/2021   K 4.1 05/22/2021   CL 105 05/22/2021   CO2 28 05/22/2021   Lab Results  Component Value Date   ALT 75 (H) 05/22/2021   AST 83 (H) 05/22/2021   BILITOT 0.5 05/22/2021    Lab Results  Component Value Date   CHOL 150 12/19/2020   TRIG 112 12/19/2020   HDL 89 12/19/2020   LDLCALC 41 12/19/2020   Lab Results  Component Value Date   HAV NON-REACTIVE 12/19/2020   Lab Results  Component Value Date   HEPBSAG NON-REACTIVE 12/19/2020   HEPBSAB NON-REACTIVE 12/19/2020   No results found for: HCVAB Lab Results  Component Value Date   CHLAMYDIAWP Negative 02/27/2021   N Negative 02/27/2021   No results found for: GCPROBEAPT No results found for: QUANTGOLD   Health Maintenance: Immunization History  Administered Date(s) Administered   Influenza,inj,Quad PF,6+ Mos 01/02/2021   Moderna Sars-Covid-2 Vaccination 02/11/2020, 03/10/2020   PFIZER Comirnaty(Gray Top)Covid-19 Tri-Sucrose Vaccine 01/02/2021   Assessment/Plan: Problem List Items Addressed This Visit       Other   HIV disease (HCC) - Primary   Relevant Medications   bictegravir-emtricitabine-tenofovir AF (BIKTARVY) 50-200-25 MG TABS tablet   Smoking   Alcohol use   Immunization due   Transaminitis   Diarrhea   Relevant Orders   Gastrointestinal Pathogen Panel PCR    HIV Continue Biktarvy, meds refilled  Counseled on adherence Fu in 3 months  Chronic Diarrhea Will get GI PCR panel and C , was not collected last time Low suspicion for OIs given well controlled HIV Will consider referral to GI if above work up no revealing. He declines GI  referral currently   Transaminitis Downtrending Likely alcohol related, counseled   Smoking Counseled  STD Screening Declined   Immunization Refused Vaccines today  Patient's labs were reviewed as well as his previous records. Patients questions were addressed and answered. Safe sex counseling done.  I spent approx 35  minute reviewing data/chart, and coordinating care and >50% direct face to face time providing counseling/discussing diagnostics/treatment plan with patient  Electronically signed by:  Odette Fraction, MD Infectious Disease Physician [facility] Infectious Disease [address] [place], [place] [address] Phone: [phone]  Fax: [phone]

## 2021-06-07 DIAGNOSIS — R197 Diarrhea, unspecified: Secondary | ICD-10-CM | POA: Insufficient documentation

## 2021-06-07 DIAGNOSIS — R7401 Elevation of levels of liver transaminase levels: Secondary | ICD-10-CM | POA: Insufficient documentation

## 2021-06-10 ENCOUNTER — Other Ambulatory Visit: Payer: Self-pay

## 2021-06-10 ENCOUNTER — Telehealth: Payer: Self-pay

## 2021-06-10 DIAGNOSIS — B20 Human immunodeficiency virus [HIV] disease: Secondary | ICD-10-CM

## 2021-06-10 MED ORDER — BICTEGRAVIR-EMTRICITAB-TENOFOV 50-200-25 MG PO TABS: 1.0000 | ORAL_TABLET | Freq: Every day | ORAL | 5 refills | Status: DC

## 2021-06-10 NOTE — Telephone Encounter (Signed)
Patient called to follow up on discussion that was had during office visit about skin care recommendations. Reports that he has skin discoloration and bumps. Requested suggestions or prescription medication (retinoid or something with salicylic acid). States he has tried multiple OTC options without success. Reports that he discussed with the provider during office visit  Rosanna Randy, RN

## 2021-07-01 ENCOUNTER — Other Ambulatory Visit (HOSPITAL_COMMUNITY): Payer: Self-pay

## 2021-07-01 ENCOUNTER — Other Ambulatory Visit: Payer: Self-pay | Admitting: Infectious Diseases

## 2021-07-13 ENCOUNTER — Other Ambulatory Visit (HOSPITAL_COMMUNITY): Payer: Self-pay

## 2021-07-13 ENCOUNTER — Other Ambulatory Visit: Payer: Self-pay | Admitting: Infectious Diseases

## 2021-07-13 ENCOUNTER — Other Ambulatory Visit: Payer: Self-pay | Admitting: Pharmacist

## 2021-07-13 DIAGNOSIS — B20 Human immunodeficiency virus [HIV] disease: Secondary | ICD-10-CM

## 2021-07-13 MED ORDER — BICTEGRAVIR-EMTRICITAB-TENOFOV 50-200-25 MG PO TABS
1.0000 | ORAL_TABLET | Freq: Every day | ORAL | 5 refills | Status: DC
  Filled 2021-07-13: qty 30, 30d supply, fill #0
  Filled 2021-08-17: qty 30, 30d supply, fill #1
  Filled 2021-11-16: qty 30, 30d supply, fill #2
  Filled 2021-12-07: qty 30, 30d supply, fill #3
  Filled 2022-01-04: qty 30, 30d supply, fill #4
  Filled 2022-02-25: qty 30, 30d supply, fill #5

## 2021-07-20 ENCOUNTER — Other Ambulatory Visit (HOSPITAL_COMMUNITY): Payer: Self-pay

## 2021-07-24 ENCOUNTER — Other Ambulatory Visit (HOSPITAL_COMMUNITY): Payer: Self-pay

## 2021-08-12 ENCOUNTER — Other Ambulatory Visit (HOSPITAL_COMMUNITY): Payer: Self-pay

## 2021-08-17 ENCOUNTER — Other Ambulatory Visit (HOSPITAL_COMMUNITY): Payer: Self-pay

## 2021-08-31 ENCOUNTER — Other Ambulatory Visit (HOSPITAL_COMMUNITY): Payer: Self-pay

## 2021-09-15 ENCOUNTER — Telehealth: Payer: Self-pay

## 2021-09-15 NOTE — Telephone Encounter (Signed)
Patient called asking about a skin cream that he thought was sent in. States he talked to the provider about it during his appointment in July and that he needed it for chest acne.   Advised that he should reach out to his PCP, he says he does not have one and thought that Dr. Elinor Parkinson was his PCP. Emphasized the importance of primary care, as Dr. Leafy Half main focus is caring for his HIV. Patient verbalized understanding and has no further questions.   Sandie Ano, RN

## 2021-09-23 ENCOUNTER — Other Ambulatory Visit: Payer: Self-pay

## 2021-09-23 ENCOUNTER — Other Ambulatory Visit (HOSPITAL_COMMUNITY): Payer: Self-pay

## 2021-09-23 ENCOUNTER — Ambulatory Visit (INDEPENDENT_AMBULATORY_CARE_PROVIDER_SITE_OTHER): Payer: 59 | Admitting: Infectious Diseases

## 2021-09-23 ENCOUNTER — Other Ambulatory Visit (HOSPITAL_COMMUNITY)
Admission: RE | Admit: 2021-09-23 | Discharge: 2021-09-23 | Disposition: A | Discharge status: Home / Self Care | Payer: 59 | Source: Ambulatory Visit | Attending: Infectious Diseases | Admitting: Infectious Diseases

## 2021-09-23 ENCOUNTER — Encounter: Payer: Self-pay | Admitting: Infectious Diseases

## 2021-09-23 VITALS — BP 137/88 | HR 115 | Temp 99.5°F | Wt 138.4 lb

## 2021-09-23 DIAGNOSIS — Z113 Encounter for screening for infections with a predominantly sexual mode of transmission: Secondary | ICD-10-CM | POA: Diagnosis not present

## 2021-09-23 DIAGNOSIS — K529 Noninfective gastroenteritis and colitis, unspecified: Secondary | ICD-10-CM | POA: Diagnosis not present

## 2021-09-23 DIAGNOSIS — R21 Rash and other nonspecific skin eruption: Secondary | ICD-10-CM

## 2021-09-23 DIAGNOSIS — B20 Human immunodeficiency virus [HIV] disease: Secondary | ICD-10-CM | POA: Diagnosis not present

## 2021-09-23 DIAGNOSIS — Z1159 Encounter for screening for other viral diseases: Secondary | ICD-10-CM | POA: Diagnosis present

## 2021-09-23 DIAGNOSIS — Z23 Encounter for immunization: Secondary | ICD-10-CM | POA: Diagnosis not present

## 2021-09-23 NOTE — Progress Notes (Addendum)
[address], [place], [place], [address]                                                                  Phn. [phone]; Fax: [phone]                                                                             Date: 09/23/21  Reason for Visit: HIV follow up   HPI: Charles Dalton is a 45 y.o.old male with a history of HIV who is here for follow-up. Last seen 7/8 with CD4 46% (2784) and VL <20 in 04/2021. Taking Biktarvy, missed one to 4 doses a months due to forgetting but consistent for the most part. Discussed about taking ART at the same time of the day. Gave a pill chain to help with compliance. He already has a pill box at home. Denies barriers to adherence of tx.   Continues to have diarrhea which he describes as ongoing for a year at least now. He say severy time he eats, 30 mins later h ehas explosive gasy diarrhea. Consistency of stool is variable from watery to semisolid to solid. He has at least 3 solid stools in a week. Denies blood in stool. Denies nausea, vomiting and abdominal painl Appetite is good. He has gained more than 2 kgs since July 2022. Denies any fevers, chills and sweats.   Denies being sexually active.  Smokes a pack a day, drinks alcohol 4 shots a day , smokes marijuana every day. He is willing to get Flu vaccine today.  ROS: Denies dysphagia, odynophagia, cough, fever, nausea, vomiting,  constipation, weight loss, chills, night sweats, recent hospitalizations, rashes, joint complaints, shortness of breath, headaches, chest pain, abdominal pain, dysuria .  Current Outpatient Medications on File Prior to Visit  Medication Sig Dispense Refill   bictegravir-emtricitabine-tenofovir AF (BIKTARVY) 50-200-25 MG TABS tablet Take 1 tablet by mouth daily. 30 tablet 5    nicotine (NICODERM CQ - DOSED IN MG/24 HR) 7 mg/24hr patch Place 1 patch (7 mg total) onto the skin daily. (Patient not taking: Reported on 06/05/2021) 28 patch 0   No current facility-administered medications on file prior to visit.                          No Known Allergies   Social History   Socioeconomic History   Marital status: Single    Spouse name: Not on file   Number of children: Not on file   Years of education: Not on file   Highest education level: Not on file  Occupational History  Not on file  Tobacco Use   Smoking status: Every Day   Smokeless tobacco: Never  Substance and Sexual Activity   Alcohol use: Not on file   Drug use: Not on file   Sexual activity: Not on file  Other Topics Concern   Not on file  Social History Narrative   Not on file   Social Determinants of Health   Financial Resource Strain: Not on file  Food Insecurity: Not on file  Transportation Needs: Not on file  Physical Activity: Not on file  Stress: Not on file  Social Connections: Not on file  Intimate Partner Violence: Not on file   No family history on file.   Vitals  BP 137/88   Pulse (!) 115   Temp 99.5 F (37.5 C) (Oral)   Wt 138 lb 6.4 oz (62.8 kg)   SpO2 97%   BMI 20.44 kg/m    Examination  Gen: Alert and oriented x 3, no acute distress HEENT: no scleral icterus, no pale conjunctivae, hearing normal, oral mucosa moist Neck: Supple Cardio: Regular rate and rhythm; +S1 and S2 Resp: CTAB GI: Soft, nontender, nondistended GU: Musc: Extremities: No cyanosis, clubbing, or edema Skin: No rashes, lesions, or ecchymoses Neuro: No focal deficits Psych: Calm, cooperative Skin:    Lab Results HIV 1 RNA Quant  Date Value  05/22/2021 <20 Copies/mL (H)  02/27/2021 <20 DETECTED copies/mL (A)  12/19/2020 <20 NOT DETECTED copies/mL   No results found for: HIV1GENOSEQ Lab Results  Component Value Date   WBC 4.9 05/22/2021   HGB 14.1 05/22/2021   HCT 41.7  05/22/2021   MCV 104.5 (H) 05/22/2021   PLT 218 05/22/2021    Lab Results  Component Value Date   CREATININE 1.01 05/22/2021   BUN 18 05/22/2021   NA 141 05/22/2021   K 4.1 05/22/2021   CL 105 05/22/2021   CO2 28 05/22/2021   Lab Results  Component Value Date   ALT 75 (H) 05/22/2021   AST 83 (H) 05/22/2021   BILITOT 0.5 05/22/2021    Lab Results  Component Value Date   CHOL 150 12/19/2020   TRIG 112 12/19/2020   HDL 89 12/19/2020   LDLCALC 41 12/19/2020   Lab Results  Component Value Date   HAV NON-REACTIVE 12/19/2020   Lab Results  Component Value Date   HEPBSAG NON-REACTIVE 12/19/2020   HEPBSAB NON-REACTIVE 12/19/2020   No results found for: HCVAB Lab Results  Component Value Date   CHLAMYDIAWP Negative 02/27/2021   N Negative 02/27/2021   No results found for: GCPROBEAPT No results found for: QUANTGOLD   Health Maintenance: Immunization History  Administered Date(s) Administered   Influenza,inj,Quad PF,6+ Mos 01/02/2021   Moderna Sars-Covid-2 Vaccination 02/11/2020, 03/10/2020   PFIZER Comirnaty(Gray Top)Covid-19 Tri-Sucrose Vaccine 01/02/2021   Assessment/Plan: HIV Continue Biktarvy, has adequate refills Counseled on adherence, pill chain given to improve compliance Safe sex counseling Fu in 4 months   Chronic Diarrhea In the setting of daily marijuana use  Will get GI PCR panel as stool was not collected last time.  Stool ova and parasite exam ( ? Giardiasis) Cryptosporidium antigen in stool  Low suspicion for OIs given well controlled HIV He refuses to be referred to GI and wants to do the stool study first   Transaminitis CMP  Likely alcohol related, counseled   Smoking Counseled  STD Screening Urine GC and RPR  Immunization Flu vaccine today   Patient's labs were reviewed as well as his previous  records. Patients questions were addressed and answered. Safe sex counseling done.  I spent approx 35  minute reviewing data/chart,  and coordinating care and >50% direct face to face time providing counseling/discussing diagnostics/treatment plan with patient  Electronically signed by:  Odette Fraction, MD Infectious Disease Physician [facility] for Infectious Disease [address] [place], [place] [address] Phone: [phone]  Fax: [phone]

## 2021-09-23 NOTE — Addendum Note (Signed)
Addended by: Odette Fraction on: 09/23/2021 06:36 PM   Modules accepted: Orders

## 2021-09-25 ENCOUNTER — Other Ambulatory Visit (HOSPITAL_COMMUNITY): Payer: Self-pay

## 2021-09-25 LAB — URINE CYTOLOGY ANCILLARY ONLY
Chlamydia: NEGATIVE
Comment: NEGATIVE
Comment: NORMAL
Neisseria Gonorrhea: NEGATIVE

## 2021-09-28 LAB — COMPREHENSIVE METABOLIC PANEL
AG Ratio: 1.7 (calc) (ref 1.0–2.5)
ALT: 45 U/L (ref 9–46)
AST: 80 U/L — ABNORMAL HIGH (ref 10–40)
Albumin: 4.4 g/dL (ref 3.6–5.1)
Alkaline phosphatase (APISO): 90 U/L (ref 36–130)
BUN: 14 mg/dL (ref 7–25)
CO2: 25 mmol/L (ref 20–32)
Calcium: 8.8 mg/dL (ref 8.6–10.3)
Chloride: 104 mmol/L (ref 98–110)
Creat: 1.02 mg/dL (ref 0.60–1.29)
Globulin: 2.6 g/dL (calc) (ref 1.9–3.7)
Glucose, Bld: 113 mg/dL — ABNORMAL HIGH (ref 65–99)
Potassium: 3 mmol/L — ABNORMAL LOW (ref 3.5–5.3)
Sodium: 142 mmol/L (ref 135–146)
Total Bilirubin: 0.7 mg/dL (ref 0.2–1.2)
Total Protein: 7 g/dL (ref 6.1–8.1)

## 2021-09-28 LAB — HIV-1 RNA ULTRAQUANT REFLEX TO GENTYP+
HIV 1 RNA Quant: NOT DETECTED copies/mL
HIV-1 RNA Quant, Log: NOT DETECTED Log copies/mL

## 2021-09-28 LAB — RPR: RPR Ser Ql: NONREACTIVE

## 2021-10-07 ENCOUNTER — Telehealth: Payer: Self-pay | Admitting: Dermatology

## 2021-10-07 NOTE — Telephone Encounter (Signed)
Patient is calling for a referral appointment from Odette Fraction, M.D.  Patient does not want to wait until May 2023 for an appointment so would like referral sent back to Dr. Leafy Half office.

## 2021-10-07 NOTE — Telephone Encounter (Signed)
Notes documented and referral routed back to referring office. 

## 2021-10-09 ENCOUNTER — Other Ambulatory Visit (HOSPITAL_COMMUNITY): Payer: Self-pay

## 2021-10-09 ENCOUNTER — Telehealth: Payer: Self-pay

## 2021-10-09 NOTE — Telephone Encounter (Signed)
Patient unable to see dermatologist until May 2023; requesting a new referral be placed somewhere that he can be seen sooner. RN recommended the patient identify a dermatology office that is accepting new patients sooner then May and that accepts his insurance. Once he does that we can send a referral to their office.   Katharine Rochefort Loyola Mast, RN

## 2021-10-28 ENCOUNTER — Other Ambulatory Visit: Payer: Self-pay

## 2021-10-28 ENCOUNTER — Other Ambulatory Visit: Payer: 59

## 2021-10-28 DIAGNOSIS — K529 Noninfective gastroenteritis and colitis, unspecified: Secondary | ICD-10-CM

## 2021-10-28 NOTE — Addendum Note (Signed)
Addended by: Harley Alto on: 10/28/2021 02:01 PM   Modules accepted: Orders

## 2021-10-29 LAB — CRYPTOSPORIDIUM ANTIGEN, EIA
Specimen Quality:: ADEQUATE
micro Number:: 12697244

## 2021-11-16 ENCOUNTER — Other Ambulatory Visit (HOSPITAL_COMMUNITY): Payer: Self-pay

## 2021-12-04 ENCOUNTER — Telehealth (INDEPENDENT_AMBULATORY_CARE_PROVIDER_SITE_OTHER): Payer: 59 | Admitting: Internal Medicine

## 2021-12-04 ENCOUNTER — Other Ambulatory Visit: Payer: Self-pay | Admitting: Internal Medicine

## 2021-12-04 ENCOUNTER — Telehealth: Payer: Self-pay

## 2021-12-04 ENCOUNTER — Other Ambulatory Visit: Payer: Self-pay

## 2021-12-04 DIAGNOSIS — U071 COVID-19: Secondary | ICD-10-CM

## 2021-12-04 MED ORDER — NIRMATRELVIR/RITONAVIR (PAXLOVID)TABLET: 3.0000 | ORAL_TABLET | Freq: Two times a day (BID) | ORAL | 0 refills | Status: AC

## 2021-12-04 NOTE — Progress Notes (Signed)
ff

## 2021-12-04 NOTE — Progress Notes (Addendum)
Virtual Visit via Telephone/Video Note   I connected with on 12/04/21  at by telephone and verified that I am speaking with the correct person using two identifiers.   I discussed the limitations, risks, security and privacy concerns of performing an evaluation and management service by telephone and the availability of in person appointments. I also discussed with the patient that there may be a patient responsible charge related to this service. The patient expressed understanding and agreed to proceed. 15 minutes spent with patient  Location: [facility]  Patient: Home Provider: Clinic  Patient Active Problem List   Diagnosis Date Noted   Transaminitis 06/07/2021   Diarrhea 06/07/2021   HIV disease ([place]) 01/02/2021   Smoking 01/02/2021   Alcohol use 01/02/2021   Screening for STDs (sexually transmitted diseases) 01/02/2021   Immunization due 01/02/2021    Patient's Medications  New Prescriptions   NIRMATRELVIR/RITONAVIR EUA (PAXLOVID) 20 X 150 MG & 10 X 100MG TABS    Take 3 tablets by mouth 2 (two) times daily for 5 days. Patient GFR is  Take nirmatrelvir (150 mg) two tablets twice daily for 5 days and ritonavir (100 mg) one tablet twice daily for 5 days.  Previous Medications   BICTEGRAVIR-EMTRICITABINE-TENOFOVIR AF (BIKTARVY) 50-200-25 MG TABS TABLET    Take 1 tablet by mouth daily.   NICOTINE (NICODERM CQ - DOSED IN MG/24 HR) 7 MG/24HR PATCH    Place 1 patch (7 mg total) onto the skin daily.  Modified Medications   No medications on file  Discontinued Medications   No medications on file    Subjective: 26 YM with HIV VL <20(09/23/21), cd4 1279(05/22/21) here for televisit for symptomatic COVID infection. He reports symptoms of cough started on Monday. He did an at home COVID test on Monday which was positive. His symptoms of nausea and diarrhea are slightly worse. Reports he gets slightly SHOB with activity. He did fever initially , but is now afebrile.   Review of  Systems: Review of Systems  All other systems reviewed and are negative.  No past medical history on file.  Social History   Tobacco Use   Smoking status: Every Day   Smokeless tobacco: Never    No family history on file.  No Known Allergies  Health Maintenance  Topic Date Due   Pneumococcal Vaccine 28-2 Years old (1 - PCV) Never done   TETANUS/TDAP  Never done   COVID-19 Vaccine (4 - Booster) 02/27/2021   COLONOSCOPY (Pts 45-44yr Insurance coverage will need to be confirmed)  Never done   INFLUENZA VACCINE  Completed   Hepatitis C Screening  Completed   HIV Screening  Completed   HPV VACCINES  Aged Out    Objective:  There were no vitals filed for this visit. There is no height or weight on file to calculate BMI.    Lab Results Lab Results  Component Value Date   WBC 4.9 05/22/2021   HGB 14.1 05/22/2021   HCT 41.7 05/22/2021   MCV 104.5 (H) 05/22/2021   PLT 218 05/22/2021    Lab Results  Component Value Date   CREATININE 1.02 09/23/2021   BUN 14 09/23/2021   NA 142 09/23/2021   K 3.0 (L) 09/23/2021   CL 104 09/23/2021   CO2 25 09/23/2021    Lab Results  Component Value Date   ALT 45 09/23/2021   AST 80 (H) 09/23/2021   BILITOT 0.7 09/23/2021    Lab Results  Component Value Date   CHOL 150 12/19/2020   HDL 89 12/19/2020   LDLCALC 41 12/19/2020   TRIG 112 12/19/2020   CHOLHDL 1.7 12/19/2020   Lab Results  Component Value Date   LABRPR NON-REACTIVE 09/23/2021   HIV 1 RNA Quant  Date Value  09/23/2021 NOT DETECTED copies/mL  05/22/2021 <20 Copies/mL (H)  02/27/2021 <20 DETECTED copies/mL (A)    #Symptomatic COVID infection -Rx Paxlovid -Discussed home isolation precautions: If symptoms improve and afebrile >24h before 10 days san D/C isolation but wear mask. If criterion is met after 10 days then discontinue mask wearing at that point.  Laurice Record, MD [facility] for Infectious Disease [facility] 12/04/2021, 3:49  PM

## 2021-12-04 NOTE — Telephone Encounter (Signed)
Patient called stating he did a home COVID test on Monday, which was positive. Patient complains of runny noses, headaches and body aches x 5 days. Patient is requesting possibly getting some antivirals. I have scheduled him for a video visit with you this afternoon. Kyrstan Gotwalt T Brooks Sailors

## 2021-12-07 ENCOUNTER — Other Ambulatory Visit (HOSPITAL_COMMUNITY): Payer: Self-pay

## 2021-12-07 ENCOUNTER — Emergency Department (HOSPITAL_COMMUNITY)
Admission: EM | Admit: 2021-12-07 | Discharge: 2021-12-08 | Disposition: A | Discharge status: Home / Self Care | Payer: 59 | Attending: Emergency Medicine | Admitting: Emergency Medicine

## 2021-12-07 ENCOUNTER — Telehealth: Payer: Self-pay

## 2021-12-07 ENCOUNTER — Other Ambulatory Visit: Payer: Self-pay

## 2021-12-07 ENCOUNTER — Emergency Department (HOSPITAL_COMMUNITY): Payer: 59

## 2021-12-07 DIAGNOSIS — R030 Elevated blood-pressure reading, without diagnosis of hypertension: Secondary | ICD-10-CM | POA: Insufficient documentation

## 2021-12-07 DIAGNOSIS — U071 COVID-19: Secondary | ICD-10-CM | POA: Insufficient documentation

## 2021-12-07 DIAGNOSIS — Z21 Asymptomatic human immunodeficiency virus [HIV] infection status: Secondary | ICD-10-CM | POA: Insufficient documentation

## 2021-12-07 DIAGNOSIS — R599 Enlarged lymph nodes, unspecified: Secondary | ICD-10-CM | POA: Insufficient documentation

## 2021-12-07 DIAGNOSIS — J029 Acute pharyngitis, unspecified: Secondary | ICD-10-CM | POA: Diagnosis not present

## 2021-12-07 DIAGNOSIS — R519 Headache, unspecified: Secondary | ICD-10-CM | POA: Diagnosis present

## 2021-12-07 DIAGNOSIS — D72829 Elevated white blood cell count, unspecified: Secondary | ICD-10-CM | POA: Diagnosis not present

## 2021-12-07 DIAGNOSIS — Z5321 Procedure and treatment not carried out due to patient leaving prior to being seen by health care provider: Secondary | ICD-10-CM | POA: Insufficient documentation

## 2021-12-07 MED ORDER — ONDANSETRON HCL 4 MG/2ML IJ SOLN: 4.0000 mg | Freq: Once | INTRAMUSCULAR | Status: DC

## 2021-12-07 MED ORDER — SODIUM CHLORIDE 0.9 % IV BOLUS: 1000.0000 mL | Freq: Once | INTRAVENOUS | Status: DC

## 2021-12-07 MED ORDER — ACETAMINOPHEN 325 MG PO TABS
650.0000 mg | ORAL_TABLET | Freq: Four times a day (QID) | ORAL | Status: DC | PRN
  Administered 2021-12-07: 650 mg via ORAL
  Filled 2021-12-07: qty 2

## 2021-12-07 NOTE — Telephone Encounter (Signed)
Patient called with concern for headache and congestion due to COVID. He states he is taking the Paxlovid and Tylenol for symptom management.   He does feel that his symptoms are improving and that he is breathing better. He is concerned that he will not be able to return to work on 1/12 due to the headaches.   RN advised him that congestion and headache are common symptoms of COVID, but to let us know if his symptoms worsen or do not improve.   He is also asking that a copy of his return to work letter be sent to him, but without any reference to infectious disease. Replacement letter sent.   Sandie Ano, RN

## 2021-12-07 NOTE — ED Triage Notes (Signed)
Pt reports COVID+ from at home test since last Monday with concerns for symptoms not improving. C/o headache, high blood pressure, NV, ongoing fevers, decreased intact, and increased weakness. Last tylenol at 5 pm.

## 2021-12-07 NOTE — ED Provider Triage Note (Signed)
Emergency Medicine Provider Triage Evaluation Note  Charles Dalton , a 46 y.o. male  was evaluated in triage.  Pt complains of sore throat, cough, body aches, n/v.  Was diagnosed with COVID several days ago.  States he has been taking Paxil Opyd without much relief.  States that he feels worse today..  Review of Systems  Positive: Fever, chills, cough Negative:   Physical Exam  BP (!) 158/113 (BP Location: Right Arm)    Pulse 86    Temp 100.2 F (37.9 C)    Resp 18    SpO2 97%  Gen:   Awake, no distress   Resp:  Normal effort  MSK:   Moves extremities without difficulty  Other:    Medical Decision Making  Medically screening exam initiated at 11:03 PM.  Appropriate orders placed.  Glendell Fouse was informed that the remainder of the evaluation will be completed by another provider, this initial triage assessment does not replace that evaluation, and the importance of remaining in the ED until their evaluation is complete.  COVID   Roxy Horseman, PA-C 12/07/21 2306

## 2021-12-08 ENCOUNTER — Telehealth: Payer: Self-pay

## 2021-12-08 ENCOUNTER — Emergency Department (HOSPITAL_COMMUNITY)
Admission: EM | Admit: 2021-12-08 | Discharge: 2021-12-08 | Disposition: A | Discharge status: Home / Self Care | Payer: 59 | Source: Home / Self Care | Attending: Emergency Medicine | Admitting: Emergency Medicine

## 2021-12-08 ENCOUNTER — Emergency Department (HOSPITAL_COMMUNITY): Payer: 59

## 2021-12-08 ENCOUNTER — Encounter: Payer: Self-pay | Admitting: Emergency Medicine

## 2021-12-08 ENCOUNTER — Ambulatory Visit (INDEPENDENT_AMBULATORY_CARE_PROVIDER_SITE_OTHER)
Admission: EM | Admit: 2021-12-08 | Discharge: 2021-12-08 | Disposition: A | Discharge status: Home / Self Care | Payer: 59 | Source: Home / Self Care

## 2021-12-08 ENCOUNTER — Encounter (HOSPITAL_COMMUNITY): Payer: Self-pay | Admitting: Emergency Medicine

## 2021-12-08 DIAGNOSIS — D72829 Elevated white blood cell count, unspecified: Secondary | ICD-10-CM | POA: Insufficient documentation

## 2021-12-08 DIAGNOSIS — J029 Acute pharyngitis, unspecified: Secondary | ICD-10-CM

## 2021-12-08 DIAGNOSIS — Z21 Asymptomatic human immunodeficiency virus [HIV] infection status: Secondary | ICD-10-CM | POA: Insufficient documentation

## 2021-12-08 DIAGNOSIS — U071 COVID-19: Secondary | ICD-10-CM

## 2021-12-08 DIAGNOSIS — R599 Enlarged lymph nodes, unspecified: Secondary | ICD-10-CM | POA: Insufficient documentation

## 2021-12-08 DIAGNOSIS — E876 Hypokalemia: Secondary | ICD-10-CM

## 2021-12-08 HISTORY — DX: Asymptomatic human immunodeficiency virus (hiv) infection status: Z21

## 2021-12-08 HISTORY — DX: Human immunodeficiency virus (HIV) disease: B20

## 2021-12-08 LAB — CBC WITH DIFFERENTIAL/PLATELET
Abs Immature Granulocytes: 0.06 10*3/uL (ref 0.00–0.07)
Abs Immature Granulocytes: 0.07 10*3/uL (ref 0.00–0.07)
Basophils Absolute: 0 10*3/uL (ref 0.0–0.1)
Basophils Absolute: 0 10*3/uL (ref 0.0–0.1)
Basophils Relative: 0 %
Basophils Relative: 0 %
Eosinophils Absolute: 0 10*3/uL (ref 0.0–0.5)
Eosinophils Absolute: 0 10*3/uL (ref 0.0–0.5)
Eosinophils Relative: 0 %
Eosinophils Relative: 0 %
HCT: 38.2 % — ABNORMAL LOW (ref 39.0–52.0)
HCT: 38.7 % — ABNORMAL LOW (ref 39.0–52.0)
Hemoglobin: 12.7 g/dL — ABNORMAL LOW (ref 13.0–17.0)
Hemoglobin: 13.1 g/dL (ref 13.0–17.0)
Immature Granulocytes: 1 %
Immature Granulocytes: 1 %
Lymphocytes Relative: 11 %
Lymphocytes Relative: 13 %
Lymphs Abs: 1.4 10*3/uL (ref 0.7–4.0)
Lymphs Abs: 1.6 10*3/uL (ref 0.7–4.0)
MCH: 35.4 pg — ABNORMAL HIGH (ref 26.0–34.0)
MCH: 35.5 pg — ABNORMAL HIGH (ref 26.0–34.0)
MCHC: 33.2 g/dL (ref 30.0–36.0)
MCHC: 33.9 g/dL (ref 30.0–36.0)
MCV: 104.9 fL — ABNORMAL HIGH (ref 80.0–100.0)
MCV: 106.4 fL — ABNORMAL HIGH (ref 80.0–100.0)
Monocytes Absolute: 1.2 10*3/uL — ABNORMAL HIGH (ref 0.1–1.0)
Monocytes Absolute: 1.2 10*3/uL — ABNORMAL HIGH (ref 0.1–1.0)
Monocytes Relative: 10 %
Monocytes Relative: 10 %
Neutro Abs: 9.6 10*3/uL — ABNORMAL HIGH (ref 1.7–7.7)
Neutro Abs: 9.8 10*3/uL — ABNORMAL HIGH (ref 1.7–7.7)
Neutrophils Relative %: 76 %
Neutrophils Relative %: 78 %
Platelets: 157 10*3/uL (ref 150–400)
Platelets: 165 10*3/uL (ref 150–400)
RBC: 3.59 MIL/uL — ABNORMAL LOW (ref 4.22–5.81)
RBC: 3.69 MIL/uL — ABNORMAL LOW (ref 4.22–5.81)
RDW: 12.4 % (ref 11.5–15.5)
RDW: 12.5 % (ref 11.5–15.5)
WBC: 12.3 10*3/uL — ABNORMAL HIGH (ref 4.0–10.5)
WBC: 12.7 10*3/uL — ABNORMAL HIGH (ref 4.0–10.5)
nRBC: 0 % (ref 0.0–0.2)
nRBC: 0 % (ref 0.0–0.2)

## 2021-12-08 LAB — RESP PANEL BY RT-PCR (FLU A&B, COVID) ARPGX2
Influenza A by PCR: NEGATIVE
Influenza B by PCR: NEGATIVE
SARS Coronavirus 2 by RT PCR: POSITIVE — AB

## 2021-12-08 LAB — BASIC METABOLIC PANEL
Anion gap: 12 (ref 5–15)
Anion gap: 10 (ref 5–15)
BUN: 5 mg/dL — ABNORMAL LOW (ref 6–20)
BUN: 7 mg/dL (ref 6–20)
CO2: 27 mmol/L (ref 22–32)
CO2: 27 mmol/L (ref 22–32)
Calcium: 8.9 mg/dL (ref 8.9–10.3)
Calcium: 8.7 mg/dL — ABNORMAL LOW (ref 8.9–10.3)
Chloride: 97 mmol/L — ABNORMAL LOW (ref 98–111)
Chloride: 99 mmol/L (ref 98–111)
Creatinine, Ser: 1.04 mg/dL (ref 0.61–1.24)
Creatinine, Ser: 0.91 mg/dL (ref 0.61–1.24)
GFR, Estimated: >60 mL/min (ref >60)
GFR, Estimated: >60 mL/min (ref >60)
Glucose, Bld: 95 mg/dL (ref 70–99)
Glucose, Bld: 112 mg/dL — ABNORMAL HIGH (ref 70–99)
Potassium: 3.2 mmol/L — ABNORMAL LOW (ref 3.5–5.1)
Potassium: 3 mmol/L — ABNORMAL LOW (ref 3.5–5.1)
Sodium: 136 mmol/L (ref 135–145)
Sodium: 136 mmol/L (ref 135–145)

## 2021-12-08 LAB — TROPONIN I (HIGH SENSITIVITY): Troponin I (High Sensitivity): 3 ng/L (ref <18)

## 2021-12-08 LAB — POCT RAPID STREP A (OFFICE): Rapid Strep A Screen: NEGATIVE

## 2021-12-08 LAB — MAGNESIUM: Magnesium: 1.6 mg/dL — ABNORMAL LOW (ref 1.7–2.4)

## 2021-12-08 LAB — MONONUCLEOSIS SCREEN: Mono Screen: NEGATIVE

## 2021-12-08 MED ORDER — METOCLOPRAMIDE HCL 5 MG/ML IJ SOLN
10.0000 mg | Freq: Once | INTRAMUSCULAR | Status: AC
  Administered 2021-12-08: 10 mg via INTRAVENOUS
  Filled 2021-12-08: qty 2

## 2021-12-08 MED ORDER — AMOXICILLIN-POT CLAVULANATE 875-125 MG PO TABS: 1.0000 | ORAL_TABLET | Freq: Two times a day (BID) | ORAL | 0 refills | Status: DC

## 2021-12-08 MED ORDER — POTASSIUM CHLORIDE CRYS ER 20 MEQ PO TBCR
40.0000 meq | EXTENDED_RELEASE_TABLET | Freq: Once | ORAL | Status: AC
  Administered 2021-12-08: 40 meq via ORAL
  Filled 2021-12-08: qty 2

## 2021-12-08 MED ORDER — POTASSIUM CHLORIDE CRYS ER 20 MEQ PO TBCR: 40.0000 meq | EXTENDED_RELEASE_TABLET | Freq: Once | ORAL | 0 refills | Status: DC

## 2021-12-08 MED ORDER — DIPHENHYDRAMINE HCL 25 MG PO CAPS
50.0000 mg | ORAL_CAPSULE | Freq: Once | ORAL | Status: AC
Start: 2021-12-08 — End: 2021-12-08
  Administered 2021-12-08: 50 mg via ORAL
  Filled 2021-12-08: qty 2

## 2021-12-08 NOTE — Discharge Instructions (Addendum)
You came to the emergency department today to be evaluated for your headache blood pressure.  Your physical exam was reassuring.  Your headache, runny nose, sore throat, and nasal congestion are likely due to your COVID-19.  To help with your nasal congestion please use Afrin over the next 3 days as well as Sudafed as needed.  If your nasal congestion does not improve after 3 days please start the prescription for Augmentin to treat for possible sinus infection.  Your potassium was found to be low while here in the emergency department.  Per chart review you have a prescription for potassium and the [employer] [facility].  Please start taking this medication as prescribed.  Please follow-up with your primary care doctor or urgent care in 5 days to have your potassium level rechecked.  You came to the emergency department today with reports of Covid-19 like symptoms.   You tested positive for COVID-19. Please isolate at home for at least 7 days after the day your symptoms initially began, and THEN at least 24 hours after you are fever-free without the help of medications (Tylenol/acetaminophen and Advil/ibuprofen/Motrin) AND your symptoms are improving.  You can alternate Tylenol/acetaminophen and Advil/ibuprofen/Motrin every 4 hours for sore throat, body aches, headache or fever.   If your symptoms do not improve please follow-up with your primary care provider or urgent care.  Return to the ER for significant shortness of breath, uncontrollable vomiting, severe chest pain, inability to tolerate fluids, changes in mental status such as confusion or other concerning symptoms.

## 2021-12-08 NOTE — Telephone Encounter (Signed)
Spoke with patient, notified him that CXR was not concerning and that white blood cells were slightly elevated which is to be expected with infection.   Relayed that his potassium was slightly low and that replacement Kcl has been sent to his pharmacy for one dose and that he can supplement with bananas per Dr. [place] Bali.   He is concerned he will not be able to go back to work and states his "breath smells like death." Recommended he rest and stay hydrated and discuss return to work with his employer.   He wants to know if he has strep or a sinus infection. Advised that he was not tested for these things in the ED and that if he feels like his symptoms are worsening or not improving that he can be evaluated at urgent care. Patient verbalized understanding and has no further questions.   Beryle Flock, RN

## 2021-12-08 NOTE — ED Provider Notes (Signed)
[name] Provider Note   CSN: [id number] Arrival date & time: 12/08/21  1526     History  Chief Complaint  Patient presents with   Headache   Covid Positive    Charles Dalton is a 46 y.o. male medical history of HIV (currently on [place]).  Presents emergency department with a chief complaint of headache, sore throat, chest tightness, and hypertension.  Patient reports that he tested positive for COVID-19 on a home test on 11/29/2021.  Patient states that he has had constant symptoms since that day.  Patient states that he has been vaccinated for COVID-19 and received booster x1.  Patient was prescribed Paxil with by infectious disease and started medication on Saturday.  Patient last took Paxil medication yesterday.  Patient endorses headache over the last 3 days.  Headache onset was gradual pain progressively worse over time.  Pain is located to frontotemporal aspect of head.  Pain is worse with light.  Patient has tried taking Tylenol and ibuprofen with minimal improvement in symptoms.  Patient endorses associated nasal congestion and rhinorrhea with yellow mucus.  Denies any visual disturbance, numbness, weakness, facial asymmetry, or dysarthria.  Patient reports that he has a sore throat over the last week.  States that pain is worse with p.o. intake.  Patient denies any difficulty swallowing, difficulty breathing, drooling, high potato voice, trismus, or facial swelling.  Patient reports that he has had chest tightness over the last week.  Symptoms have been constant.  No aggravating or alleviating factors.   Headache Associated symptoms: congestion, fever, myalgias, sinus pressure and sore throat   Associated symptoms: no abdominal pain, no back pain, no cough, no dizziness, no nausea, no neck pain, no numbness, no seizures, no vomiting and no weakness       Home Medications Prior to Admission medications   Medication Sig Start Date End  Date Taking? Authorizing Provider  bictegravir-emtricitabine-tenofovir AF (BIKTARVY) 50-200-25 MG TABS tablet Take 1 tablet by mouth daily. 07/13/21   Kuppelweiser, Cassie L, RPH-CPP  nicotine (NICODERM CQ - DOSED IN MG/24 HR) 7 mg/24hr patch Place 1 patch (7 mg total) onto the skin daily. Patient not taking: Reported on 06/05/2021 02/27/21   Rosiland Oz, MD  nirmatrelvir/ritonavir EUA (PAXLOVID) 20 x 150 MG & 10 x 100MG  TABS Take 3 tablets by mouth 2 (two) times daily for 5 days. Patient GFR is  Take nirmatrelvir (150 mg) two tablets twice daily for 5 days and ritonavir (100 mg) one tablet twice daily for 5 days. 12/04/21 12/09/21  Laurice Record, MD  potassium chloride SA (KLOR-CON M20) 20 MEQ tablet Take 2 tablets (40 mEq total) by mouth once for 1 dose. 12/08/21 12/08/21  Rosiland Oz, MD      Allergies    Patient has no known allergies.    Review of Systems   Review of Systems  Constitutional:  Positive for chills and fever.  HENT:  Positive for congestion, rhinorrhea, sinus pressure and sore throat. Negative for trouble swallowing.   Eyes:  Negative for visual disturbance.  Respiratory:  Positive for chest tightness. Negative for cough and shortness of breath.   Cardiovascular:  Negative for chest pain.  Gastrointestinal:  Negative for abdominal pain, nausea and vomiting.  Genitourinary:  Negative for difficulty urinating and dysuria.  Musculoskeletal:  Positive for myalgias. Negative for back pain and neck pain.  Skin:  Negative for color change and rash.  Allergic/Immunologic: Positive for immunocompromised state.  Neurological:  Positive for headaches. Negative  for dizziness, tremors, seizures, syncope, facial asymmetry, speech difficulty, weakness, light-headedness and numbness.  Psychiatric/Behavioral:  Negative for confusion.    Physical Exam Updated Vital Signs BP (!) 164/109 (BP Location: Left Arm)    Pulse 91    Temp 99.9 F (37.7 C) (Oral)    Resp 18    SpO2 100%   Physical Exam Vitals and nursing note reviewed.  Constitutional:      General: He is not in acute distress.    Appearance: He is not ill-appearing, toxic-appearing or diaphoretic.  HENT:     Head: Normocephalic.     Nose:     Right Sinus: Maxillary sinus tenderness present. No frontal sinus tenderness.     Left Sinus: Maxillary sinus tenderness present. No frontal sinus tenderness.     Mouth/Throat:     Lips: Pink. No lesions.     Mouth: Mucous membranes are moist.     Tongue: No lesions.     Palate: No mass and lesions.     Pharynx: Oropharynx is clear. Posterior oropharyngeal erythema present. No pharyngeal swelling, oropharyngeal exudate or uvula swelling.     Tonsils: No tonsillar exudate or tonsillar abscesses. 1+ on the right. 1+ on the left.     Comments: Erythema to posterior oropharynx.  Patient handles oral secretions without difficulty Eyes:     General: No scleral icterus.       Right eye: No discharge.        Left eye: No discharge.  Neck:     Comments: No swelling to submandibular space Cardiovascular:     Rate and Rhythm: Normal rate.  Pulmonary:     Effort: Pulmonary effort is normal. No tachypnea, bradypnea or respiratory distress.     Breath sounds: Normal breath sounds. No stridor.     Comments: Speaks in full complete sentences without difficulty Musculoskeletal:     Cervical back: Normal range of motion and neck supple. No edema, erythema, signs of trauma, rigidity, torticollis or crepitus. No pain with movement, spinous process tenderness or muscular tenderness. Normal range of motion.  Lymphadenopathy:     Cervical: Cervical adenopathy present.     Left cervical: Superficial cervical adenopathy present.  Skin:    General: Skin is warm and dry.  Neurological:     General: No focal deficit present.     Mental Status: He is alert and oriented to person, place, and time.     GCS: GCS eye subscore is 4. GCS verbal subscore is 5. GCS motor subscore is 6.      Comments: Patient moves all limbs equally without difficulty.  No facial asymmetry or dysarthria.  Psychiatric:        Behavior: Behavior is cooperative.    ED Results / Procedures / Treatments   Labs (all labs ordered are listed, but only abnormal results are displayed) Labs Reviewed  RESP PANEL BY RT-PCR (FLU A&B, COVID) ARPGX2 - Abnormal; Notable for the following components:      Result Value   SARS Coronavirus 2 by RT PCR POSITIVE (*)    All other components within normal limits  CBC WITH DIFFERENTIAL/PLATELET - Abnormal; Notable for the following components:   WBC 12.3 (*)    RBC 3.69 (*)    HCT 38.7 (*)    MCV 104.9 (*)    MCH 35.5 (*)    Neutro Abs 9.6 (*)    Monocytes Absolute 1.2 (*)    All other components within normal limits  BASIC METABOLIC PANEL -  Abnormal; Notable for the following components:   Potassium 3.0 (*)    Glucose, Bld 112 (*)    Calcium 8.7 (*)    All other components within normal limits  MAGNESIUM - Abnormal; Notable for the following components:   Magnesium 1.6 (*)    All other components within normal limits  MONONUCLEOSIS SCREEN  TROPONIN I (HIGH SENSITIVITY)    EKG None  Radiology DG Chest 2 View  Result Date: 12/08/2021 CLINICAL DATA:  COVID EXAM: CHEST - 2 VIEW COMPARISON:  None. FINDINGS: Low lung volumes. No confluent airspace opacities or effusions. Heart is normal size. No acute bony abnormality. IMPRESSION: Low lung volumes.  No active disease. Electronically Signed   By: Charlett Nose M.D.   On: 12/08/2021 00:13    Procedures Procedures    Medications Ordered in ED Medications  metoCLOPramide (REGLAN) injection 10 mg (10 mg Intravenous Given 12/08/21 1802)  diphenhydrAMINE (BENADRYL) capsule 50 mg (50 mg Oral Given 12/08/21 1758)  potassium chloride SA (KLOR-CON M) CR tablet 40 mEq (40 mEq Oral Given 12/08/21 1919)    ED Course/ Medical Decision Making/ A&P                           Medical Decision Making  This patient  presents to the ED for concern of headache, hypertension, sore throat, this involves an extensive number of treatment options, and is a complaint that carries with it a high risk of complications and morbidity.    Co morbidities that complicate the patient evaluation  HIV   Additional history obtained:  Additional history obtained from patient and urgent care notes    Lab Tests:  I Ordered, and personally interpreted labs.  The pertinent results include:   CBC shows slight leukocytosis at 12.3 BMP shows potassium at 3.0, magnesium at 1.6 Troponin 3 Mononucleosis negative Respiratory panel positive for COVID-19   Cardiac Monitoring:  The patient was maintained on a cardiac monitor.  I personally viewed and interpreted the cardiac monitored which showed an underlying rhythm of: Sinus rhythm with atrial enlargement   Medicines ordered and prescription drug management:  I ordered medication including Reglan and Benadryl for headache as well as potassium Reevaluation of the patient after these medicines showed that the patient improved I have reviewed the patients home medicines and have made adjustments as needed   Test Considered:  Chest x-ray was considered however lungs clear to auscultation bilaterally; suspicion for pneumonia   Problem List / ED Course:  Headache Headache onset 3 days ago.  Pain has been constant over this time.  Onset was gradual previously worse over time.  Pain is located to frontotemporal aspect of head.  Patient endorses photophobia.  Denies any numbness, weakness, facial asymmetry or dysarthria. Low suspicion for subarachnoid hemorrhage at this time. Will plan to give patient migraine cocktail and reassess. Patient does endorse nasal congestion and rhinorrhea x10 days.  Has tenderness to bilateral maxillary sinuses.  Concern for possible acute sinusitis as cause of symptoms.  Will prescribe patient with Augmentin for possible sinusitis.  Patient  advised to try Afrin as well as other over-the-counter nasal decongestions.  If he has relief of nasal congestion and improvement in sinus pressure in 2 days patient was advised to start Augmentin COVID-19 Patient positive on home test.  Positive test is confirmed with testing here today.  Patient is currently taking Paxil with medication. Hypokalemia Potassium at 3.0, magnesium level 1.6 Per chart review  patient has history of hypokalemia.  Has recent prescription for oral potassium.  Patient advised to take potassium as prescribed and follow-up closely with PCP or urgent care for repeat testing.  Patient care was discussed with attending physician Dr. Tamera Punt   Reevaluation:  After the interventions noted above, I reevaluated the patient and found that they have :improved   Disposition:  After consideration of the diagnostic results and the patients response to treatment, I feel that the patent would benefit from discharge and close follow-up with PCP.   Discussed results, findings, treatment and follow up. Patient advised of return precautions. Patient verbalized understanding and agreed with plan.         Final Clinical Impression(s) / ED Diagnoses Final diagnoses:  None    Rx / DC Orders ED Discharge Orders     None         Dyann Ruddle 12/09/21 Angelique Blonder, MD 12/12/21 0700

## 2021-12-08 NOTE — ED Triage Notes (Signed)
Per patient, was seen at [facility] for the same symptoms-states they sent him here due to elevated BP

## 2021-12-08 NOTE — Addendum Note (Signed)
Addended by: Linna Hoff D on: 12/08/2021 11:29 AM   Modules accepted: Orders

## 2021-12-08 NOTE — ED Triage Notes (Signed)
Patient states he tested positive for COVID on 11/29/2021 from a home test.  Patient has severe HA, facial pain/pressure, nasal drainage and sore throat.  Some chest discomfort.  Patient has taken Tylenol, Mucinex, Dayquil.

## 2021-12-08 NOTE — ED Provider Notes (Signed)
[facility]    CSN: [id number] Arrival date & time: 12/08/21  1229      History   Chief Complaint Chief Complaint  Patient presents with   COVID + / HTN    HPI Charles Dalton is a 46 y.o. male.   Patient presents to urgent care today for further evaluation after testing positive for COVID-19 with an at home test on 11/29/2021.  He reports that his symptoms started around the same time as well.  He reports that he has a severe headache, sinus pressure, nasal congestion, nasal drainage, sore throat, chest tightness.  Denies chest pain, shortness of breath, nausea, vomiting, diarrhea, abdominal pain.  He reports that the headache is rated 9/10 on pain scale and rated at 12/10 on pain scale yesterday.  Has taken Tylenol, Mucinex, DayQuil with no improvement in symptoms.  He was seen by video visit a few days ago and was prescribed Paxlovid by infectious disease.  He reports that he took 2 days worth of the medication and stopped taking it.  Last dose was yesterday.  He also went to the ED yesterday and had a chest x-ray that was unremarkable as well as some blood work completed showing a mildly low potassium.  He left before being seen in the ED.  He called PCP yesterday and was prescribed potassium for mildly low potassium in ED and was advised on symptomatic treatment for COVID.  Patient presented due to severe headache and concerns for persistent symptoms.  He does have history of HIV but reports that he has been compliant with Biktarvy medication.    Past Medical History:  Diagnosis Date   HIV (human immunodeficiency virus infection) [facility](HCC)     Patient Active Problem List   Diagnosis Date Noted   Transaminitis 06/07/2021   Diarrhea 06/07/2021   HIV disease (HCC) 01/02/2021   Smoking 01/02/2021   Alcohol use 01/02/2021   Screening for STDs (sexually transmitted diseases) 01/02/2021   Immunization due 01/02/2021    History reviewed. No pertinent surgical  history.     Home Medications    Prior to Admission medications   Medication Sig Start Date End Date Taking? Authorizing Provider  bictegravir-emtricitabine-tenofovir AF (BIKTARVY) 50-200-25 MG TABS tablet Take 1 tablet by mouth daily. 07/13/21  Yes Kuppelweiser, Cassie L, RPH-CPP  nirmatrelvir/ritonavir EUA (PAXLOVID) 20 x 150 MG & 10 x 100MG  TABS Take 3 tablets by mouth 2 (two) times daily for 5 days. Patient GFR is  Take nirmatrelvir (150 mg) two tablets twice daily for 5 days and ritonavir (100 mg) one tablet twice daily for 5 days. 12/04/21 12/09/21 Yes Danelle EarthlySingh, Mayanka, MD  potassium chloride SA (KLOR-CON M20) 20 MEQ tablet Take 2 tablets (40 mEq total) by mouth once for 1 dose. 12/08/21 12/08/21 Yes Odette FractionManandhar, Sabina, MD  nicotine (NICODERM CQ - DOSED IN MG/24 HR) 7 mg/24hr patch Place 1 patch (7 mg total) onto the skin daily. Patient not taking: Reported on 06/05/2021 02/27/21   Odette FractionManandhar, Sabina, MD    Family History History reviewed. No pertinent family history.  Social History Social History   Tobacco Use   Smoking status: Every Day   Smokeless tobacco: Never     Allergies   Patient has no known allergies.   Review of Systems Review of Systems Per HPI  Physical Exam Triage Vital Signs ED Triage Vitals [12/08/21 1406]  Enc Vitals Group     BP (!) 160/114     Pulse Rate (!) 108  Resp 20     Temp 99.6 F (37.6 C)     Temp Source Oral     SpO2 96 %     Weight 138 lb 7.2 oz (62.8 kg)     Height 5\' 9"  (1.753 m)     Head Circumference      Peak Flow      Pain Score 9     Pain Loc      Pain Edu?      Excl. in GC?    No data found.  Updated Vital Signs BP (!) 160/114 (BP Location: Left Arm)    Pulse (!) 108    Temp 99.6 F (37.6 C) (Oral)    Resp 20    Ht 5\' 9"  (1.753 m)    Wt 138 lb 7.2 oz (62.8 kg)    SpO2 96%    BMI 20.45 kg/m   Visual Acuity Right Eye Distance:   Left Eye Distance:   Bilateral Distance:    Right Eye Near:   Left Eye Near:     Bilateral Near:     Physical Exam Constitutional:      General: He is not in acute distress.    Appearance: Normal appearance. He is not toxic-appearing or diaphoretic.  HENT:     Head: Normocephalic and atraumatic.     Right Ear: Tympanic membrane and ear canal normal.     Left Ear: Tympanic membrane and ear canal normal.     Nose: Congestion present.     Mouth/Throat:     Mouth: Mucous membranes are moist.     Pharynx: Posterior oropharyngeal erythema present.  Eyes:     Extraocular Movements: Extraocular movements intact.     Conjunctiva/sclera: Conjunctivae normal.     Pupils: Pupils are equal, round, and reactive to light.  Cardiovascular:     Rate and Rhythm: Normal rate and regular rhythm.     Pulses: Normal pulses.     Heart sounds: Normal heart sounds.  Pulmonary:     Effort: Pulmonary effort is normal. No respiratory distress.     Breath sounds: Normal breath sounds. No stridor. No wheezing, rhonchi or rales.  Abdominal:     General: Abdomen is flat. Bowel sounds are normal.     Palpations: Abdomen is soft.  Musculoskeletal:        General: Normal range of motion.     Cervical back: Normal range of motion.  Skin:    General: Skin is warm and dry.  Neurological:     General: No focal deficit present.     Mental Status: He is alert and oriented to person, place, and time. Mental status is at baseline.  Psychiatric:        Mood and Affect: Mood normal.        Behavior: Behavior normal.     UC Treatments / Results  Labs (all labs ordered are listed, but only abnormal results are displayed) Labs Reviewed  CULTURE, GROUP A STREP [facility])  POCT RAPID STREP A (OFFICE)    EKG   Radiology DG Chest 2 View  Result Date: 12/08/2021 CLINICAL DATA:  COVID EXAM: CHEST - 2 VIEW COMPARISON:  None. FINDINGS: Low lung volumes. No confluent airspace opacities or effusions. Heart is normal size. No acute bony abnormality. IMPRESSION: Low lung volumes.  No active disease.  Electronically Signed   By: [facility] M.D.   On: 12/08/2021 00:13    Procedures Procedures (including critical care time)  Medications Ordered  in UC Medications - No data to display  Initial Impression / Assessment and Plan / UC Course  I have reviewed the triage vital signs and the nursing notes.  Pertinent labs & imaging results that were available during my care of the patient were reviewed by me and considered in my medical decision making (see chart for details).     Patient tested positive with an at home test for COVID-19.  He has taken partial amounts of Paxlovid that was prescribed by previous healthcare provider a few days prior.  Patient requesting strep testing in urgent care given severe sore throat but this was negative.  Throat culture is pending.  Patient was advised that it would be be best for him to go to the hospital for further evaluation and management given severe headache in the setting of high blood pressure.  He also has COVID-19 in the setting of HIV and could be immunocompromised.  Do think the patient needs more extensive evaluation than can be evaluated at the urgent care given current physical exam and symptoms.  Patient was agreeable with plan and left via self transport to go to the hospital. Final Clinical Impressions(s) / UC Diagnoses   Final diagnoses:  COVID-19  Sore throat     Discharge Instructions      Please go to the emergency department as soon as you leave urgent care for further evaluation and management.    ED Prescriptions   None    PDMP not reviewed this encounter.   Gustavus Bryant, [place] 12/08/21 [phone]

## 2021-12-08 NOTE — Telephone Encounter (Signed)
Patient called, says he went to the emergency department last night due to his headaches. He says the pain last night was a 12/10.   He had blood work and a chest xray done, but left before being seen. He would like his provider to look at these results and explain what they mean. He says his head is still hurting at an 8/10. Advised that if his headaches worsen suddenly that he needs to go back to the emergency department. Patient verbalized understanding and has no further questions.   Sandie Ano, RN

## 2021-12-08 NOTE — ED Provider Triage Note (Signed)
Emergency Medicine Provider Triage Evaluation Note  Charles Dalton , a 46 y.o. male  was evaluated in triage.  Pt complains of Bad headache, sinus pressure, sore throat, fatigue, poor appetite. Hx HIV compliant with Biktarvy. Sent by urgent care, negative strep test earlier today. Also c/o chest tightness. Pos home test for covid on 11/29/2021 T mzax 100.6 at home last night  Review of Systems  Positive: headache Negative: vomiting  Physical Exam  BP (!) 164/109 (BP Location: Left Arm)    Pulse 91    Temp 99.9 F (37.7 C) (Oral)    Resp 18    SpO2 100%  Gen:   Awake, no distress   Resp:  Normal effort  MSK:   Moves extremities without difficulty  Other:   Mild pharyngeal erythema  Medical Decision Making  Medically screening exam initiated at 3:54 PM.  Appropriate orders placed.  [name] was informed that the remainder of the evaluation will be completed by another provider, this initial triage assessment does not replace that evaluation, and the importance of remaining in the ED until their evaluation is complete.  Labs ordered, work up inititated.   Arthor Captain, PA-C 12/08/21 1604

## 2021-12-08 NOTE — ED Notes (Signed)
Patient states he is going to come back when we are less busy

## 2021-12-08 NOTE — Discharge Instructions (Signed)
Please go to the emergency department as soon as you leave urgent care for further evaluation and management. ?

## 2021-12-08 NOTE — ED Notes (Signed)
Pt ambulatory without assistance.  

## 2021-12-09 ENCOUNTER — Other Ambulatory Visit (HOSPITAL_COMMUNITY): Payer: Self-pay

## 2021-12-10 ENCOUNTER — Other Ambulatory Visit (HOSPITAL_COMMUNITY): Payer: Self-pay

## 2021-12-11 LAB — CULTURE, GROUP A STREP (THRC)

## 2021-12-13 ENCOUNTER — Other Ambulatory Visit: Payer: Self-pay | Admitting: Infectious Diseases

## 2021-12-14 ENCOUNTER — Other Ambulatory Visit (HOSPITAL_COMMUNITY): Payer: Self-pay

## 2021-12-22 ENCOUNTER — Ambulatory Visit (INDEPENDENT_AMBULATORY_CARE_PROVIDER_SITE_OTHER): Payer: 59 | Admitting: Nurse Practitioner

## 2021-12-22 ENCOUNTER — Other Ambulatory Visit: Payer: Self-pay

## 2021-12-22 ENCOUNTER — Encounter: Payer: Self-pay | Admitting: Nurse Practitioner

## 2021-12-22 VITALS — BP 122/83 | HR 96 | Temp 98.4°F | Ht 68.0 in | Wt 137.2 lb

## 2021-12-22 DIAGNOSIS — Z1211 Encounter for screening for malignant neoplasm of colon: Secondary | ICD-10-CM

## 2021-12-22 DIAGNOSIS — E876 Hypokalemia: Secondary | ICD-10-CM

## 2021-12-22 DIAGNOSIS — D72829 Elevated white blood cell count, unspecified: Secondary | ICD-10-CM

## 2021-12-22 DIAGNOSIS — Z0000 Encounter for general adult medical examination without abnormal findings: Secondary | ICD-10-CM

## 2021-12-22 DIAGNOSIS — Z7689 Persons encountering health services in other specified circumstances: Secondary | ICD-10-CM | POA: Diagnosis not present

## 2021-12-22 NOTE — Progress Notes (Signed)
Error

## 2021-12-22 NOTE — Progress Notes (Signed)
New Patient Office Visit  Subjective:  Patient ID: Willliam Dalton, male    DOB: 04-Nov-1976  Age: 46 y.o. MRN: [id number]  CC:  Chief Complaint  Patient presents with   New Patient (Initial Visit)    HPI Charles Dalton presents to establish new primary care provider. He currently sees infectious disease specialist for most things. He is HIV positive. Takes daily medication to keep HIV from becoming AIDS. He recently had been diagnosed with COVID 19. Was seen in ER due to the symptoms he was feeling. Had chest discomfort. Was found to have low potassium, low calcium, and low magnesium. His WBC was elevated and was put on augmentin.  His blood pressure was moderately elevated in ER and he had severe headache. Blood pressure is very good today. He states that he continues to have headaches. He states that excessive light triggers headaches. Was not having this symptom prior to having COVID.   Past Medical History:  Diagnosis Date   High blood pressure    HIV (human immunodeficiency virus infection) ([place])     History reviewed. No pertinent surgical history.  Family History  Problem Relation Age of Onset   Prostate cancer Father    Prostate cancer Maternal Grandfather     Social History   Socioeconomic History   Marital status: Single    Spouse name: Not on file   Number of children: Not on file   Years of education: Not on file   Highest education level: Not on file  Occupational History   Not on file  Tobacco Use   Smoking status: Every Day   Smokeless tobacco: Never  Substance and Sexual Activity   Alcohol use: Yes   Drug use: Not Currently   Sexual activity: Not on file  Other Topics Concern   Not on file  Social History Narrative   Not on file   Social Determinants of Health   Financial Resource Strain: Not on file  Food Insecurity: Not on file  Transportation Needs: Not on file  Physical Activity: Not on file  Stress: Not on file  Social Connections: Not on  file  Intimate Partner Violence: Not on file    ROS Review of Systems  Constitutional:  Negative for activity change, chills, fatigue and fever.  HENT:  Negative for congestion, postnasal drip, rhinorrhea, sinus pressure, sinus pain, sneezing and sore throat.   Eyes: Negative.   Respiratory:  Negative for cough, shortness of breath and wheezing.   Cardiovascular:  Negative for chest pain and palpitations.  Gastrointestinal:  Negative for constipation, diarrhea, nausea and vomiting.  Endocrine: Negative for cold intolerance, heat intolerance, polydipsia and polyuria.  Genitourinary:  Negative for dysuria, frequency and urgency.  Musculoskeletal:  Negative for back pain and myalgias.  Skin:  Negative for rash.  Allergic/Immunologic: Negative for environmental allergies.  Neurological:  Negative for dizziness, weakness and headaches.  Psychiatric/Behavioral:  The patient is not nervous/anxious.    Objective:   Today's Vitals   12/22/21 1106  BP: 122/83  Pulse: 96  Temp: 98.4 F (36.9 C)  SpO2: 97%  Weight: 137 lb 3.2 oz (62.2 kg)  Height: _0  (1.727 m)   Body mass index is 20.86 kg/m.   Physical Exam Vitals and nursing note reviewed.  Constitutional:      Appearance: Normal appearance. He is well-developed.  HENT:     Head: Normocephalic and atraumatic.     Nose: Nose normal.     Mouth/Throat:  Mouth: Mucous membranes are moist.     Pharynx: Oropharynx is clear.  Eyes:     Extraocular Movements: Extraocular movements intact.     Conjunctiva/sclera: Conjunctivae normal.     Pupils: Pupils are equal, round, and reactive to light.  Cardiovascular:     Rate and Rhythm: Normal rate and regular rhythm.     Pulses: Normal pulses.     Heart sounds: Normal heart sounds.  Pulmonary:     Effort: Pulmonary effort is normal.     Breath sounds: Normal breath sounds.  Abdominal:     Palpations: Abdomen is soft.  Musculoskeletal:        General: Normal range of motion.      Cervical back: Normal range of motion and neck supple.  Lymphadenopathy:     Cervical: No cervical adenopathy.  Skin:    General: Skin is warm and dry.     Capillary Refill: Capillary refill takes less than 2 seconds.  Neurological:     General: No focal deficit present.     Mental Status: He is alert and oriented to person, place, and time.  Psychiatric:        Mood and Affect: Mood normal.        Behavior: Behavior normal.        Thought Content: Thought content normal.        Judgment: Judgment normal.    Assessment & Plan:  1. Encounter to establish care Appointment today to establish new primary care provider.   2. Hypomagnesemia Will check magnesium level today and treat deficiency as indicated. - Magnesium; Future - Magnesium  3. Hypokalemia Check BMP potassium levels 3.0.  Treat deficiency as indicated.  4. Screening for colon cancer Refer to GI for screening colonoscopy. - Ambulatory referral to Gastroenterology  5. Leukocytosis, unspecified type Check CBC due to elevated white blood cell count during recent ER visit.  6. Healthcare maintenance Routine, fasting labs were drawn during today's visit. - Comp Met (CMET); Future - CBC with Differential/Platelet; Future - Hemoglobin A1c; Future - Lipid panel; Future - TSH; Future - TSH - Lipid panel - Hemoglobin A1c - CBC with Differential/Platelet - Comp Met (CMET)   Problem List Items Addressed This Visit       Other   Screening for colon cancer   Relevant Orders   Ambulatory referral to Gastroenterology   Hypomagnesemia   Relevant Orders   Magnesium (Completed)   Hypokalemia   Leukocytosis   Other Visit Diagnoses     Encounter to establish care    -  Primary   Healthcare maintenance       Relevant Orders   Comp Met (CMET) (Completed)   CBC with Differential/Platelet (Completed)   Hemoglobin A1c (Completed)   Lipid panel (Completed)   TSH (Completed)       Outpatient Encounter  Medications as of 12/22/2021  Medication Sig   amoxicillin-clavulanate (AUGMENTIN) 875-125 MG tablet Take 1 tablet by mouth every 12 (twelve) hours.   bictegravir-emtricitabine-tenofovir AF (BIKTARVY) 50-200-25 MG TABS tablet Take 1 tablet by mouth daily.   potassium chloride SA (KLOR-CON M20) 20 MEQ tablet Take 2 tablets (40 mEq total) by mouth once for 1 dose.   [DISCONTINUED] nicotine (NICODERM CQ - DOSED IN MG/24 HR) 7 mg/24hr patch Place 1 patch (7 mg total) onto the skin daily. (Patient not taking: Reported on 06/05/2021)   No facility-administered encounter medications on file as of 12/22/2021.    Follow-up: Return in about 3 weeks (around  01/12/2022) for health maintenance exam.   Ronnell Freshwater, NP  This note was dictated using Dragon Voice Recognition Software. Rapid proofreading was performed to expedite the delivery of the information. Despite proofreading, phonetic errors will occur which are common with this voice recognition software. Please take this into consideration. If there are any concerns, please contact our office.

## 2021-12-23 LAB — HEMOGLOBIN A1C
Est. average glucose Bld gHb Est-mCnc: 97 mg/dL
Hgb A1c MFr Bld: 5 % (ref 4.8–5.6)

## 2021-12-23 LAB — COMPREHENSIVE METABOLIC PANEL
ALT: 35 IU/L (ref 0–44)
AST: 39 IU/L (ref 0–40)
Albumin/Globulin Ratio: 1.4 (ref 1.2–2.2)
Albumin: 4.6 g/dL (ref 4.0–5.0)
Alkaline Phosphatase: 103 IU/L (ref 44–121)
BUN/Creatinine Ratio: 13 (ref 9–20)
BUN: 16 mg/dL (ref 6–24)
Bilirubin Total: 0.2 mg/dL (ref 0.0–1.2)
CO2: 21 mmol/L (ref 20–29)
Calcium: 9.6 mg/dL (ref 8.7–10.2)
Chloride: 104 mmol/L (ref 96–106)
Creatinine, Ser: 1.21 mg/dL (ref 0.76–1.27)
Globulin, Total: 3.2 g/dL (ref 1.5–4.5)
Glucose: 103 mg/dL — ABNORMAL HIGH (ref 70–99)
Potassium: 5.5 mmol/L — ABNORMAL HIGH (ref 3.5–5.2)
Sodium: 143 mmol/L (ref 134–144)
Total Protein: 7.8 g/dL (ref 6.0–8.5)
eGFR: 75 mL/min/{1.73_m2} (ref >59)

## 2021-12-23 LAB — CBC WITH DIFFERENTIAL/PLATELET
Basophils Absolute: 0.1 10*3/uL (ref 0.0–0.2)
Basos: 1 %
EOS (ABSOLUTE): 0 10*3/uL (ref 0.0–0.4)
Eos: 0 %
Hematocrit: 37 % — ABNORMAL LOW (ref 37.5–51.0)
Hemoglobin: 12.7 g/dL — ABNORMAL LOW (ref 13.0–17.7)
Immature Grans (Abs): 0.2 10*3/uL — ABNORMAL HIGH (ref 0.0–0.1)
Immature Granulocytes: 2 %
Lymphocytes Absolute: 2.6 10*3/uL (ref 0.7–3.1)
Lymphs: 33 %
MCH: 35.5 pg — ABNORMAL HIGH (ref 26.6–33.0)
MCHC: 34.3 g/dL (ref 31.5–35.7)
MCV: 103 fL — ABNORMAL HIGH (ref 79–97)
Monocytes Absolute: 0.6 10*3/uL (ref 0.1–0.9)
Monocytes: 8 %
Neutrophils Absolute: 4.4 10*3/uL (ref 1.4–7.0)
Neutrophils: 56 %
Platelets: 448 10*3/uL (ref 150–450)
RBC: 3.58 x10E6/uL — ABNORMAL LOW (ref 4.14–5.80)
RDW: 11.9 % (ref 11.6–15.4)
WBC: 7.7 10*3/uL (ref 3.4–10.8)

## 2021-12-23 LAB — TSH: TSH: 0.538 u[IU]/mL (ref 0.450–4.500)

## 2021-12-23 LAB — LIPID PANEL
Chol/HDL Ratio: 2 ratio (ref 0.0–5.0)
Cholesterol, Total: 160 mg/dL (ref 100–199)
HDL: 82 mg/dL (ref >39)
LDL Chol Calc (NIH): 60 mg/dL (ref 0–99)
Triglycerides: 104 mg/dL (ref 0–149)
VLDL Cholesterol Cal: 18 mg/dL (ref 5–40)

## 2021-12-23 LAB — MAGNESIUM: Magnesium: 2.3 mg/dL (ref 1.6–2.3)

## 2021-12-23 NOTE — Progress Notes (Signed)
Please let the patient know that his labs are back. Blood count is stable with normal white blood cell count. His potassium and magnesium levels are both normal. Other labs are also normal.  Thanks so much.   -HB

## 2021-12-27 DIAGNOSIS — E876 Hypokalemia: Secondary | ICD-10-CM | POA: Insufficient documentation

## 2021-12-27 DIAGNOSIS — D72829 Elevated white blood cell count, unspecified: Secondary | ICD-10-CM | POA: Insufficient documentation

## 2021-12-31 ENCOUNTER — Other Ambulatory Visit (HOSPITAL_COMMUNITY): Payer: Self-pay

## 2022-01-04 ENCOUNTER — Other Ambulatory Visit (HOSPITAL_COMMUNITY): Payer: Self-pay

## 2022-01-07 ENCOUNTER — Other Ambulatory Visit (HOSPITAL_COMMUNITY): Payer: Self-pay

## 2022-01-13 ENCOUNTER — Encounter: Payer: 59 | Admitting: Nurse Practitioner

## 2022-01-20 ENCOUNTER — Ambulatory Visit: Payer: 59 | Admitting: Infectious Diseases

## 2022-02-01 ENCOUNTER — Encounter: Payer: Self-pay | Admitting: Nurse Practitioner

## 2022-02-01 ENCOUNTER — Other Ambulatory Visit: Payer: Self-pay

## 2022-02-01 ENCOUNTER — Ambulatory Visit (INDEPENDENT_AMBULATORY_CARE_PROVIDER_SITE_OTHER): Payer: 59 | Admitting: Nurse Practitioner

## 2022-02-01 VITALS — BP 151/96 | HR 72 | Temp 98.1°F | Ht 68.0 in | Wt 139.6 lb

## 2022-02-01 DIAGNOSIS — Z0001 Encounter for general adult medical examination with abnormal findings: Secondary | ICD-10-CM | POA: Diagnosis not present

## 2022-02-01 DIAGNOSIS — B20 Human immunodeficiency virus [HIV] disease: Secondary | ICD-10-CM

## 2022-02-01 DIAGNOSIS — J3481 Nasal mucositis (ulcerative): Secondary | ICD-10-CM

## 2022-02-01 DIAGNOSIS — R030 Elevated blood-pressure reading, without diagnosis of hypertension: Secondary | ICD-10-CM | POA: Insufficient documentation

## 2022-02-01 DIAGNOSIS — L989 Disorder of the skin and subcutaneous tissue, unspecified: Secondary | ICD-10-CM

## 2022-02-01 DIAGNOSIS — J324 Chronic pansinusitis: Secondary | ICD-10-CM

## 2022-02-01 MED ORDER — MUPIROCIN 2 % EX OINT: 1.0000 "application " | TOPICAL_OINTMENT | Freq: Two times a day (BID) | CUTANEOUS | 1 refills | Status: DC

## 2022-02-01 MED ORDER — DOXYCYCLINE HYCLATE 100 MG PO TABS: 100.0000 mg | ORAL_TABLET | Freq: Two times a day (BID) | ORAL | 0 refills | Status: DC

## 2022-02-01 NOTE — Progress Notes (Signed)
Established patient visit   Patient: Charles Dalton   DOB: 05/09/1976   46 y.o. Male  MRN: [id number] Visit Date: 02/01/2022   Chief Complaint  Patient presents with   Annual Exam   Subjective    HPI  The patient is here for annual exam.  -routine fasting labs done prior to today were essentially normal.   -mild anemia. Stable.   -potassium elevated, though patient was taking a potassium supplement at the time.   -normalized magnesium level. -had COVID in January. Has had a very bad odor in his nose/sinuses, some congestion and post nasal drip. Able to use a [employer] which does help the symptoms for a few days.  -has some bumps on his chest, back, and abdomen which have been present for years. Would like to have checked out per dermatologist. Start off like ingrown hair or pimple. After fluid expelled, leaves a hard and nodular bumps.    Medications: Outpatient Medications Prior to Visit  Medication Sig   bictegravir-emtricitabine-tenofovir AF (BIKTARVY) 50-200-25 MG TABS tablet Take 1 tablet by mouth daily.   [DISCONTINUED] amoxicillin-clavulanate (AUGMENTIN) 875-125 MG tablet Take 1 tablet by mouth every 12 (twelve) hours.   potassium chloride SA (KLOR-CON M20) 20 MEQ tablet Take 2 tablets (40 mEq total) by mouth once for 1 dose.   No facility-administered medications prior to visit.    Review of Systems  Constitutional:  Negative for activity change, chills, fatigue and fever.  HENT:  Positive for congestion, postnasal drip and rhinorrhea. Negative for sinus pressure, sinus pain, sneezing and sore throat.   Eyes: Negative.   Respiratory:  Negative for cough, shortness of breath and wheezing.   Cardiovascular:  Negative for chest pain and palpitations.  Gastrointestinal:  Negative for constipation, diarrhea, nausea and vomiting.  Endocrine: Negative for cold intolerance, heat intolerance, polydipsia and polyuria.  Genitourinary:  Negative for dysuria, frequency and urgency.   Musculoskeletal:  Negative for back pain and myalgias.  Skin:  Negative for rash.       Lesions present on the chest, abdomen, and back.   Allergic/Immunologic: Negative for environmental allergies.  Neurological:  Negative for dizziness, weakness and headaches.  Psychiatric/Behavioral:  The patient is not nervous/anxious.    Last CBC Lab Results  Component Value Date   WBC 7.7 12/22/2021   HGB 12.7 (L) 12/22/2021   HCT 37.0 (L) 12/22/2021   MCV 103 (H) 12/22/2021   MCH 35.5 (H) 12/22/2021   RDW 11.9 12/22/2021   PLT 448 35/57/3220   Last metabolic panel Lab Results  Component Value Date   GLUCOSE 103 (H) 12/22/2021   NA 143 12/22/2021   K 5.5 (H) 12/22/2021   CL 104 12/22/2021   CO2 21 12/22/2021   BUN 16 12/22/2021   CREATININE 1.21 12/22/2021   EGFR 75 12/22/2021   CALCIUM 9.6 12/22/2021   PROT 7.8 12/22/2021   ALBUMIN 4.6 12/22/2021   LABGLOB 3.2 12/22/2021   AGRATIO 1.4 12/22/2021   BILITOT 0.2 12/22/2021   ALKPHOS 103 12/22/2021   AST 39 12/22/2021   ALT 35 12/22/2021   ANIONGAP 10 12/08/2021   Last lipids Lab Results  Component Value Date   CHOL 160 12/22/2021   HDL 82 12/22/2021   LDLCALC 60 12/22/2021   TRIG 104 12/22/2021   CHOLHDL 2.0 12/22/2021   Last hemoglobin A1c Lab Results  Component Value Date   HGBA1C 5.0 12/22/2021   Last thyroid functions Lab Results  Component Value Date   TSH 0.538 12/22/2021  Objective     Today's Vitals   02/01/22 0944  BP: (!) 151/96  Pulse: 72  Temp: 98.1 F (36.7 C)  SpO2: 100%  Weight: 139 lb 9.6 oz (63.3 kg)  Height: _0  (1.727 m)   Body mass index is 21.23 kg/m.   BP Readings from Last 3 Encounters:  02/01/22 (!) 151/96  12/22/21 122/83  12/08/21 (!) 164/109    Wt Readings from Last 3 Encounters:  02/01/22 139 lb 9.6 oz (63.3 kg)  12/22/21 137 lb 3.2 oz (62.2 kg)  12/08/21 138 lb 7.2 oz (62.8 kg)    Physical Exam Vitals and nursing note reviewed.  Constitutional:       Appearance: Normal appearance. He is well-developed.  HENT:     Head: Normocephalic and atraumatic.     Right Ear: Tympanic membrane, ear canal and external ear normal.     Left Ear: Tympanic membrane, ear canal and external ear normal.     Nose: Congestion present.     Right Turbinates: Swollen.     Left Turbinates: Swollen.     Right Sinus: Frontal sinus tenderness present.     Left Sinus: Frontal sinus tenderness present.  Eyes:     Pupils: Pupils are equal, round, and reactive to light.  Cardiovascular:     Rate and Rhythm: Normal rate and regular rhythm.     Pulses: Normal pulses.     Heart sounds: Normal heart sounds.  Pulmonary:     Effort: Pulmonary effort is normal.     Breath sounds: Normal breath sounds.  Abdominal:     General: Bowel sounds are normal. There is no distension.     Palpations: Abdomen is soft. There is no mass.     Tenderness: There is no abdominal tenderness. There is no guarding or rebound.  Musculoskeletal:        General: Normal range of motion.     Cervical back: Normal range of motion and neck supple.  Lymphadenopathy:     Cervical: No cervical adenopathy.  Skin:    General: Skin is warm and dry.     Capillary Refill: Capillary refill takes less than 2 seconds.     Findings: Lesion present.     Comments: There are numerous hyperpigmented areas of skin on the chest, abdomen, and back. They are in various stages of healing. Some are scabbed with a rough and raised texture. Some are smooth and flat. All mease between 1 and 2 cm in diameter. They are non tender. Currently, skin is intact and there is no drainage from any of the lesions.   Neurological:     General: No focal deficit present.     Mental Status: He is alert and oriented to person, place, and time.  Psychiatric:        Mood and Affect: Mood normal.        Behavior: Behavior normal.        Thought Content: Thought content normal.        Judgment: Judgment normal.      Assessment &  Plan    1. Encounter for general adult medical examination with abnormal findings Annual wellness visit today.   2. Elevated blood pressure reading in office without diagnosis of hypertension DASH diet reviewed and written information provided today.   3. Chronic pansinusitis Has been ongoing since having COVID in January 2023. Start doxycycline 165m twice daily. Continue using netty pot to prevent further infection and inflammation and infection of the  nasal passages and sinuses.  - doxycycline (VIBRA-TABS) 100 MG tablet; Take 1 tablet (100 mg total) by mouth 2 (two) times daily.  Dispense: 28 tablet; Refill: 0  4. Non-ulcerative nasal mucositis Continue using netty pot to prevent further infection and inflammation and infection of the nasal passages and sinuses.  Add bactroban ointment. Advised him to apply a small amount to the nasal mucosa twice daily for next 7 days.  - doxycycline (VIBRA-TABS) 100 MG tablet; Take 1 tablet (100 mg total) by mouth 2 (two) times daily.  Dispense: 28 tablet; Refill: 0 - mupirocin ointment (BACTROBAN) 2 %; Place 1 application. into the nose 2 (two) times daily.  Dispense: 22 g; Refill: 1  5. Skin lesion of chest wall Multiple skin lesions to the chest, abdomen, and back. Unclear etiology. Doxycycline prescribed for chronic sinusitis to help improve skin. May also apply bactroban ointment to all lesions twice daily as needed. Refer to dermatology for further evaluation and treatment.  - Ambulatory referral to Dermatology  6. HIV disease ([place]) Continue regular visits with ID.    Problem List Items Addressed This Visit       Respiratory   Chronic pansinusitis   Relevant Medications   doxycycline (VIBRA-TABS) 100 MG tablet   Non-ulcerative nasal mucositis   Relevant Medications   doxycycline (VIBRA-TABS) 100 MG tablet   mupirocin ointment (BACTROBAN) 2 %     Musculoskeletal and Integument   Skin lesion of chest wall   Relevant Orders   Ambulatory  referral to Dermatology     Other   HIV disease ([place])   Relevant Medications   mupirocin ointment (BACTROBAN) 2 %   Elevated blood pressure reading in office without diagnosis of hypertension   Other Visit Diagnoses     Encounter for general adult medical examination with abnormal findings    -  Primary        Return in about 1 year (around 02/02/2023) for health maintenance exam, FBW a week prior to visit.         Ronnell Freshwater, NP  [facility] [phone] (phone) [phone] (fax)  [place]

## 2022-02-02 ENCOUNTER — Other Ambulatory Visit (HOSPITAL_COMMUNITY): Payer: Self-pay

## 2022-02-05 ENCOUNTER — Other Ambulatory Visit (HOSPITAL_COMMUNITY): Payer: Self-pay

## 2022-02-18 ENCOUNTER — Other Ambulatory Visit (HOSPITAL_COMMUNITY): Payer: Self-pay

## 2022-02-25 ENCOUNTER — Other Ambulatory Visit (HOSPITAL_COMMUNITY): Payer: Self-pay

## 2022-03-02 ENCOUNTER — Other Ambulatory Visit (HOSPITAL_COMMUNITY): Payer: Self-pay

## 2022-03-16 ENCOUNTER — Ambulatory Visit (INDEPENDENT_AMBULATORY_CARE_PROVIDER_SITE_OTHER): Payer: 59

## 2022-03-16 ENCOUNTER — Ambulatory Visit (INDEPENDENT_AMBULATORY_CARE_PROVIDER_SITE_OTHER): Payer: 59 | Admitting: Infectious Diseases

## 2022-03-16 ENCOUNTER — Telehealth: Payer: Self-pay | Admitting: Nurse Practitioner

## 2022-03-16 ENCOUNTER — Other Ambulatory Visit: Payer: Self-pay

## 2022-03-16 ENCOUNTER — Encounter: Payer: Self-pay | Admitting: Infectious Diseases

## 2022-03-16 VITALS — BP 163/107 | HR 73 | Temp 99.2°F | Wt 135.8 lb

## 2022-03-16 DIAGNOSIS — Z23 Encounter for immunization: Secondary | ICD-10-CM

## 2022-03-16 DIAGNOSIS — K529 Noninfective gastroenteritis and colitis, unspecified: Secondary | ICD-10-CM | POA: Diagnosis not present

## 2022-03-16 DIAGNOSIS — Z113 Encounter for screening for infections with a predominantly sexual mode of transmission: Secondary | ICD-10-CM | POA: Diagnosis not present

## 2022-03-16 DIAGNOSIS — R21 Rash and other nonspecific skin eruption: Secondary | ICD-10-CM

## 2022-03-16 DIAGNOSIS — IMO0001 Reserved for inherently not codable concepts without codable children: Secondary | ICD-10-CM

## 2022-03-16 DIAGNOSIS — B20 Human immunodeficiency virus [HIV] disease: Secondary | ICD-10-CM | POA: Diagnosis not present

## 2022-03-16 DIAGNOSIS — F17200 Nicotine dependence, unspecified, uncomplicated: Secondary | ICD-10-CM

## 2022-03-16 MED ORDER — BICTEGRAVIR-EMTRICITAB-TENOFOV 50-200-25 MG PO TABS: 1.0000 | ORAL_TABLET | Freq: Every day | ORAL | 11 refills | Status: DC

## 2022-03-16 NOTE — Progress Notes (Addendum)
?                                                           [address], [place], [place], [address] ?                                                                 Phn. [phone]; Fax: [phone] ?                                                                            Date: 03/16/22 ? ?Reason for Visit: HIV follow up ? ? ?HPI: Charles Dalton is a 46 y.o.old male with a history of HIV who is here for follow-up. Since last seen, he had covid early Jan and completed a course of paxlovid. hHe was seen in the ED on 1/10 for headache in the setting of sinusitis. He was also seen by PCP and has a Dematology referral for skin lesions in his trunk. He recently completed a 14 days course of doxycycline for sinusitis and skins lesions + mupirocin ointment.  ? ?Missed 5 doses of Biktarvy due to forgetting since last seen. Smoking cigarettes  1 pack a day. Denies alcohol and IVDU.  Denies being sexually active. Denies feeling sad/depressed. Has a fu with dentist. Fu with Derm in Nov and GI in June for colonoscopy. Appetite is good. Denies nausea, vomiting and abdominal pain but no improvement in diarrhea. Denies being sexually active. Willing to get COVID bivalent today and PCV 20 in next visit  ? ?ROS: Denies dysphagia, odynophagia, cough, fever, nausea, vomiting,  constipation, weight loss, chills, night sweats, recent hospitalizations, rashes, joint complaints, shortness of breath, headaches, chest pain, abdominal pain, dysuria . ? ?Current Outpatient Medications on File Prior to Visit  ?Medication Sig Dispense Refill  ? potassium chloride SA (KLOR-CON M20) 20 MEQ tablet Take 2 tablets (40 mEq total) by mouth once for 1 dose. 2 tablet 0  ? ?No current facility-administered medications on file prior to visit.  ? ?No Known Allergies  ? ?Social History  ? ?Socioeconomic History   ? Marital status: Single  ?  Spouse name: Not on file  ? Number of children: Not on file  ? Years of education: Not on file  ? Highest education level: Not on file  ?Occupational History  ? Not on file  ?Tobacco Use  ? Smoking status: Every Day  ? Smokeless tobacco: Never  ?Substance and Sexual Activity  ? Alcohol use: Yes  ? Drug use: Not Currently  ? Sexual activity: Not on file  ?Other Topics Concern  ? Not on file  ?Social History Narrative  ? Not on file  ? ?Social Determinants of Health  ? ?Financial Resource Strain: Not on file  ?Food Insecurity: Not on file  ?Transportation  Needs: Not on file  ?Physical Activity: Not on file  ?Stress: Not on file  ?Social Connections: Not on file  ?Intimate Partner Violence: Not on file  ? ?Family History  ?Problem Relation Age of Onset  ? Prostate cancer Father   ? Prostate cancer Maternal Grandfather   ? ? ?Vitals  ?BP (!) 163/107   Pulse 73   Temp 99.2 ?F (37.3 ?C) (Oral)   Wt 135 lb 12.8 oz (61.6 kg)   SpO2 100%   BMI 20.65 kg/m?  ? ? ? ?Examination  ?Gen: Alert and oriented x 3, no acute distress ?HEENT: no scleral icterus, no pale conjunctivae, hearing normal, oral mucosa moist ?Neck: Supple ?Cardio: Regular rate ?Resp: CTAB ?GI: Soft, nondistended ?GU: ?Musc: ?Extremities: No pedal edema ?Neuro: grossly non focal ?Psych: Calm, cooperative ?Skin: chronic appearing dry skin lesions in the trunk  ? ? ? ?Lab Results ?HIV 1 RNA Quant  ?Date Value  ?09/23/2021 NOT DETECTED copies/mL  ?05/22/2021 <20 Copies/mL (H)  ?02/27/2021 <20 DETECTED copies/mL (A)  ? ?No results found for: HIV1GENOSEQ ?Lab Results  ?Component Value Date  ? WBC 7.7 12/22/2021  ? HGB 12.7 (L) 12/22/2021  ? HCT 37.0 (L) 12/22/2021  ? MCV 103 (H) 12/22/2021  ? PLT 448 12/22/2021  ?  ?Lab Results  ?Component Value Date  ? CREATININE 1.21 12/22/2021  ? BUN 16 12/22/2021  ? NA 143 12/22/2021  ? K 5.5 (H) 12/22/2021  ? CL 104 12/22/2021  ? CO2 21 12/22/2021  ? ?Lab Results  ?Component Value Date  ? ALT  35 12/22/2021  ? AST 39 12/22/2021  ? ALKPHOS 103 12/22/2021  ? BILITOT 0.2 12/22/2021  ?  ?Lab Results  ?Component Value Date  ? CHOL 160 12/22/2021  ? TRIG 104 12/22/2021  ? HDL 82 12/22/2021  ? LDLCALC 60 12/22/2021  ? ?Lab Results  ?Component Value Date  ? HAV NON-REACTIVE 12/19/2020  ? ?Lab Results  ?Component Value Date  ? HEPBSAG NON-REACTIVE 12/19/2020  ? HEPBSAB NON-REACTIVE 12/19/2020  ? ?No results found for: HCVAB ?Lab Results  ?Component Value Date  ? CHLAMYDIAWP Negative 09/23/2021  ? N Negative 09/23/2021  ? ?No results found for: GCPROBEAPT ?No results found for: QUANTGOLD ? ? ?Health Maintenance: ?Immunization History  ?Administered Date(s) Administered  ? Influenza,inj,Quad PF,6+ Mos 01/02/2021, 09/23/2021  ? Moderna Sars-Covid-2 Vaccination 02/11/2020, 03/10/2020  ? PFIZER Comirnaty(Gray Top)Covid-19 Tri-Sucrose Vaccine 01/02/2021  ? ?Problem List Items Addressed This Visit   ? ?  ? Digestive  ? Chronic diarrhea  ? Relevant Orders  ? Giardia antigen  ? Ova and parasite examination  ? Ova and parasite examination  ? Ova and parasite examination  ? Gastrointestinal Panel by PCR , Stool  ?  ? Other  ? HIV disease ([facility]) - Primary  ? Relevant Medications  ? bictegravir-emtricitabine-tenofovir AF (BIKTARVY) 50-200-25 MG TABS tablet  ? Other Relevant Orders  ? HIV-1 RNA ultraquant reflex to gentyp+  ? RPR  ? T-helper cells (CD4) count (not at [facility])  ? Basic metabolic panel (Completed)  ? Smoking  ? Immunization due  ? Screening for venereal disease  ? Relevant Orders  ? Urine cytology ancillary only  ? ? ?Assessment/Plan: ?# HIV ?Continue Biktarvy, meds refilled  ?Counseled on adherence ?Fu in 5 months  ?Labs today  ? ?# Chronic Diarrhea ?In the setting of daily marijuana use  ?Cryptosporidium ag in stool negative ?Will get GI PCR panel/Giardia ag/Stool ova and parasite as stool was not collected last  time. ?Low suspicion for OIs given well controlled HIV ?GI referral made by PCP ? ?#  Smoking ?Counseled ? ?# STD Screening ?Urine GC and RPR ? ?# Immunization counseling  ?COVID bivalent today  ? ?Patient's labs were reviewed as well as his previous records. ?Patients questions were addressed and answered. ?Safe sex counseling done. ? ?I spent approx 40 minute reviewing data/chart, and coordinating care and >50% direct face to face time providing counseling/discussing diagnostics/treatment plan with patient ? ?Electronically signed by: ? ?Odette FractionSabina Dariya Gainer, MD ?Infectious Disease Physician ?[facility] ?[address] ?[place], [place] [address] ?Phone: [phone]  Fax: [phone] ? ?

## 2022-03-16 NOTE — Telephone Encounter (Signed)
Patient was referred to dermatology but they cannot get him in until November. Can you send him somewhere else? [phone] ?

## 2022-03-17 DIAGNOSIS — R21 Rash and other nonspecific skin eruption: Secondary | ICD-10-CM | POA: Insufficient documentation

## 2022-03-17 DIAGNOSIS — K529 Noninfective gastroenteritis and colitis, unspecified: Secondary | ICD-10-CM | POA: Insufficient documentation

## 2022-03-17 DIAGNOSIS — Z113 Encounter for screening for infections with a predominantly sexual mode of transmission: Secondary | ICD-10-CM | POA: Insufficient documentation

## 2022-03-17 NOTE — Addendum Note (Signed)
Addended by: Odette Fraction on: 03/17/2022 08:13 AM ? ? Modules accepted: Orders ? ?

## 2022-03-17 NOTE — Telephone Encounter (Signed)
Can you find out where he was referred to? I can try a different place, however, the dermatology referrals seem to be taking a very long time. Thanks

## 2022-03-18 LAB — HELPER T-LYMPH-CD4 (ARMC ONLY)
% CD 4 Pos. Lymph.: 55.7 % (ref 30.8–58.5)
Absolute CD 4 Helper: 1003 /uL (ref 359–1519)
Basophils Absolute: 0 10*3/uL (ref 0.0–0.2)
Basos: 1 %
EOS (ABSOLUTE): 0 10*3/uL (ref 0.0–0.4)
Eos: 0 %
Hematocrit: 40 % (ref 37.5–51.0)
Hemoglobin: 13.4 g/dL (ref 13.0–17.7)
Immature Grans (Abs): 0 10*3/uL (ref 0.0–0.1)
Immature Granulocytes: 1 %
Lymphocytes Absolute: 1.8 10*3/uL (ref 0.7–3.1)
Lymphs: 49 %
MCH: 36 pg — ABNORMAL HIGH (ref 26.6–33.0)
MCHC: 33.5 g/dL (ref 31.5–35.7)
MCV: 108 fL — ABNORMAL HIGH (ref 79–97)
Monocytes Absolute: 0.4 10*3/uL (ref 0.1–0.9)
Monocytes: 10 %
Neutrophils Absolute: 1.4 10*3/uL (ref 1.4–7.0)
Neutrophils: 39 %
Platelets: 209 10*3/uL (ref 150–450)
RBC: 3.72 x10E6/uL — ABNORMAL LOW (ref 4.14–5.80)
RDW: 13 % (ref 11.6–15.4)
WBC: 3.7 10*3/uL (ref 3.4–10.8)

## 2022-03-18 NOTE — Telephone Encounter (Signed)
Tried calling patient and there was no answer, no vm ?

## 2022-03-20 LAB — BASIC METABOLIC PANEL
BUN: 12 mg/dL (ref 7–25)
CO2: 30 mmol/L (ref 20–32)
Calcium: 9.5 mg/dL (ref 8.6–10.3)
Chloride: 106 mmol/L (ref 98–110)
Creat: 0.97 mg/dL (ref 0.60–1.29)
Glucose, Bld: 91 mg/dL (ref 65–99)
Potassium: 4.2 mmol/L (ref 3.5–5.3)
Sodium: 147 mmol/L — ABNORMAL HIGH (ref 135–146)

## 2022-03-20 LAB — HIV-1 RNA ULTRAQUANT REFLEX TO GENTYP+
HIV 1 RNA Quant: <20 {copies}/mL — AB
HIV-1 RNA Quant, Log: <1.3 {Log_copies}/mL — AB

## 2022-03-20 LAB — SYPHILIS: RPR W/REFLEX TO RPR TITER AND TREPONEMAL ANTIBODIES, TRADITIONAL SCREENING AND DIAGNOSIS ALGORITHM: RPR Ser Ql: NONREACTIVE

## 2022-03-22 ENCOUNTER — Telehealth: Payer: Self-pay

## 2022-03-22 NOTE — Telephone Encounter (Signed)
Called patient regarding lab results. Relayed message about VL. Does not have any questions at this time.  ?Juanita Laster, RMA ?  ?

## 2022-03-22 NOTE — Telephone Encounter (Signed)
-----   Message from Odette Fraction, MD sent at 03/22/2022  7:08 AM EDT ----- ?Let him know he is undetectable.  ?

## 2022-03-23 ENCOUNTER — Other Ambulatory Visit (HOSPITAL_COMMUNITY): Payer: Self-pay

## 2022-03-23 LAB — T-HELPER CELLS (CD4) COUNT (NOT AT ARMC)

## 2022-03-25 ENCOUNTER — Other Ambulatory Visit (HOSPITAL_COMMUNITY): Payer: Self-pay

## 2022-03-29 ENCOUNTER — Other Ambulatory Visit: Payer: Self-pay | Admitting: Pharmacist

## 2022-03-29 ENCOUNTER — Other Ambulatory Visit (HOSPITAL_COMMUNITY): Payer: Self-pay

## 2022-03-29 ENCOUNTER — Other Ambulatory Visit: Payer: Self-pay

## 2022-03-29 DIAGNOSIS — B20 Human immunodeficiency virus [HIV] disease: Secondary | ICD-10-CM

## 2022-03-29 MED ORDER — BICTEGRAVIR-EMTRICITAB-TENOFOV 50-200-25 MG PO TABS
1.0000 | ORAL_TABLET | Freq: Every day | ORAL | 6 refills | Status: DC
  Filled 2022-03-29: qty 30, 30d supply, fill #0

## 2022-03-30 ENCOUNTER — Other Ambulatory Visit (HOSPITAL_COMMUNITY): Payer: Self-pay

## 2022-04-19 ENCOUNTER — Other Ambulatory Visit (HOSPITAL_COMMUNITY): Payer: Self-pay

## 2022-04-21 ENCOUNTER — Other Ambulatory Visit (HOSPITAL_COMMUNITY): Payer: Self-pay

## 2022-07-19 ENCOUNTER — Telehealth: Payer: Self-pay | Admitting: Nurse Practitioner

## 2022-07-19 NOTE — Telephone Encounter (Signed)
The dermatologist that he was referred to the office has closed and he needs a new referral to a different dermatologist.

## 2022-07-21 ENCOUNTER — Other Ambulatory Visit: Payer: Self-pay | Admitting: Nurse Practitioner

## 2022-07-21 DIAGNOSIS — L989 Disorder of the skin and subcutaneous tissue, unspecified: Secondary | ICD-10-CM

## 2022-07-21 NOTE — Telephone Encounter (Signed)
Called pt LVM to contact the office °

## 2022-07-21 NOTE — Telephone Encounter (Signed)
Please let the patient know that a new referral to [facility] has been placed. Thanks  -HB

## 2022-07-22 NOTE — Telephone Encounter (Signed)
Called pt LVM to contact the office °

## 2022-07-23 NOTE — Telephone Encounter (Signed)
Called pt LVM to contact the office °

## 2022-08-12 ENCOUNTER — Other Ambulatory Visit (HOSPITAL_COMMUNITY): Payer: Self-pay

## 2022-08-16 ENCOUNTER — Other Ambulatory Visit (HOSPITAL_COMMUNITY): Payer: Self-pay

## 2022-10-05 ENCOUNTER — Ambulatory Visit: Payer: 59 | Admitting: Dermatology

## 2022-11-04 ENCOUNTER — Ambulatory Visit: Payer: 59 | Admitting: Infectious Diseases

## 2022-11-08 ENCOUNTER — Ambulatory Visit (INDEPENDENT_AMBULATORY_CARE_PROVIDER_SITE_OTHER): Payer: 59 | Admitting: Infectious Diseases

## 2022-11-08 ENCOUNTER — Encounter: Payer: Self-pay | Admitting: Infectious Diseases

## 2022-11-08 ENCOUNTER — Other Ambulatory Visit: Payer: Self-pay

## 2022-11-08 VITALS — BP 171/118 | HR 87 | Temp 98.6°F | Ht 68.0 in | Wt 138.0 lb

## 2022-11-08 DIAGNOSIS — Z113 Encounter for screening for infections with a predominantly sexual mode of transmission: Secondary | ICD-10-CM | POA: Diagnosis not present

## 2022-11-08 DIAGNOSIS — Z0000 Encounter for general adult medical examination without abnormal findings: Secondary | ICD-10-CM

## 2022-11-08 DIAGNOSIS — B20 Human immunodeficiency virus [HIV] disease: Secondary | ICD-10-CM

## 2022-11-08 DIAGNOSIS — R030 Elevated blood-pressure reading, without diagnosis of hypertension: Secondary | ICD-10-CM

## 2022-11-08 DIAGNOSIS — K529 Noninfective gastroenteritis and colitis, unspecified: Secondary | ICD-10-CM | POA: Diagnosis not present

## 2022-11-08 MED ORDER — BICTEGRAVIR-EMTRICITAB-TENOFOV 50-200-25 MG PO TABS: 1.0000 | ORAL_TABLET | Freq: Every day | ORAL | 11 refills | Status: DC

## 2022-11-08 NOTE — Progress Notes (Incomplete)
[place], [place], [place], [address]                                                                  Phn. [phone]; Fax: [phone]                                                                             Date: 11/08/22  Reason for Visit: HIV follow up   HPI: Charles Dalton is a 46 y.o.old male with a history of HIV who is here for follow-up. Reports being compliant with Biktarvy, no missed doses or barriers to adherence. Continues to have ongoing diarrhea, describes as watery to soft, every time when he eats food. Denies blood in stool. Denies nausea and vomiting, abdominal discomfort sometimes. Continues to smoke marijuana. He has not seen GI yet neither has submitted stool samples for GI PCR as well as stool ova and parasites. He does not tell me clearly why he  has not seen GI yet but is agreeable to see them once his new insurance kicks in from January. Denies any appetite or weight loss. He has an appt with dermatology in May for chronic appearing scattered lesions in his trunk. Denies any new skin lesions. Denies being sexually active. Denies GU symptoms. He follows with [facility].   ROS: Denies dysphagia, odynophagia, nausea, vomiting,  constipation, weight loss, fevers, chills, night sweats, recent hospitalizations, rashes, joint complaints, shortness of breath, cough, chest pain, headaches, abdominal pain, dysuria .  Current Outpatient Medications on File Prior to Visit  Medication Sig Dispense Refill   bictegravir-emtricitabine-tenofovir AF (BIKTARVY) 50-200-25 MG TABS tablet Take 1 tablet by mouth daily. 30 tablet 6   No current facility-administered medications on file prior to visit.    No Known Allergies   Social History   Socioeconomic History   Marital  status: Single    Spouse name: Not on file   Number of children: Not on file   Years of education: Not on file   Highest education level: Not on file  Occupational History   Not on file  Tobacco Use   Smoking status: Every Day   Smokeless tobacco: Never  Substance and Sexual Activity   Alcohol use: Yes   Drug use: Not Currently   Sexual activity: Not on file  Other Topics Concern   Not on file  Social History Narrative   Not on file   Social Determinants of Health   Financial Resource Strain: Not on file  Food Insecurity: Not on file  Transportation Needs: Not on file  Physical Activity: Not on file  Stress: Not on file  Social Connections: Not on file  Intimate Partner Violence: Not on file   Family History  Problem Relation Age of Onset   Prostate cancer Father    Prostate cancer Maternal Grandfather     Vitals  BP (!) 171/118   Pulse 87   Temp 98.6 F (37 C) (Oral)   Ht 5\' 8"  (1.727 m)   Wt 138 lb (62.6 kg)   BMI 20.98 kg/m    Examination  Gen: Alert and oriented x 3, no acute distress HEENT: no scleral icterus, no pale conjunctivae, hearing normal, oral mucosa moist Neck: Supple Cardio: Regular rate Resp: CTAB GI: Soft, nondistended, non tender  GU: Musc: Extremities: No pedal edema Neuro: grossly non focal Psych: Calm, cooperative Skin: chronic appearing dry skin lesions in the trunk - appears stable with no new lesions  Lab Results HIV 1 RNA Quant  Date Value  03/16/2022 <20 DETECTED copies/mL (A)  09/23/2021 NOT DETECTED copies/mL  05/22/2021 <20 Copies/mL (H)   No results found for: "HIV1GENOSEQ" Lab Results  Component Value Date   WBC 3.7 03/16/2022   HGB 13.4 03/16/2022   HCT 40.0 03/16/2022   MCV 108 (H) 03/16/2022   PLT 209 03/16/2022    Lab Results  Component Value Date   CREATININE 0.97 03/16/2022   BUN 12 03/16/2022   NA 147 (H) 03/16/2022   K 4.2 03/16/2022   CL 106 03/16/2022   CO2 30 03/16/2022   Lab Results   Component Value Date   ALT 35 12/22/2021   AST 39 12/22/2021   ALKPHOS 103 12/22/2021   BILITOT 0.2 12/22/2021    Lab Results  Component Value Date   CHOL 160 12/22/2021   TRIG 104 12/22/2021   HDL 82 12/22/2021   LDLCALC 60 12/22/2021   Lab Results  Component Value Date   HAV NON-REACTIVE 12/19/2020   Lab Results  Component Value Date   HEPBSAG NON-REACTIVE 12/19/2020   HEPBSAB NON-REACTIVE 12/19/2020   No results found for: "HCVAB" Lab Results  Component Value Date   CHLAMYDIAWP Negative 09/23/2021   N Negative 09/23/2021   No results found for: "GCPROBEAPT" No results found for: "QUANTGOLD"   Health Maintenance: Immunization History  Administered Date(s) Administered   Influenza,inj,Quad PF,6+ Mos 01/02/2021, 09/23/2021   Moderna Sars-Covid-2 Vaccination 02/11/2020, 03/10/2020   PFIZER Comirnaty(Gray Top)Covid-19 Tri-Sucrose Vaccine 01/02/2021   Pfizer Covid-19 Vaccine Bivalent Booster 44yrs & up 03/16/2022    Assessment/Plan: # HIV Continue Biktarvy Meds refilled Labs today  ACSVD 10 year risk approx 9%.  Fu in 3 months  Fu in 5 months  Labs today   # Chronic Diarrhea, unclear casue  In the setting of daily marijuana use, advised to quit to see if helps 10/28/2021 Cryptosporidium ag EIA  in stool negative Fu Giardia ag stool 03/16/22 Will get GI PCR panel/Stool ova and parasite as stool was not collected last time. Low suspicion for OIs given well controlled HIV GI referral made by PCP, encouraged to fu to evaluate for causes   # Smoking Counseled  # Elevated BP without prior h/o HTN  - denies chest pain/SOB and headache/neurological complaints. Discussed to go to ED if any concerning symptoms.  - discussed to fu with PCP   # STD Screening Urine GC and RPR No acute concerns   # Immunization counseling  Refused vaccines today as he is having some sinus congestion and cold   # HM ACSVD 10 year risk approx 9%.  LDL at 60 Discussed to quit  smoking as well as fu with PCP for management of HTN Will reassess need for statin in next visit.   Patient's labs were reviewed as well as his previous records. Patients questions were addressed and answered. Safe sex counseling done.  I spent approx 40 minute reviewing data/chart, and coordinating care and >50% direct face to face time providing counseling/discussing diagnostics/treatment plan with patient  Electronically signed by:  Odette Fraction, MD Infectious Disease Physician [facility] [address] [place], [place] [address] Phone: [phone]  Fax: [phone]

## 2022-11-09 ENCOUNTER — Other Ambulatory Visit: Payer: Self-pay

## 2022-11-09 ENCOUNTER — Other Ambulatory Visit (HOSPITAL_COMMUNITY): Payer: Self-pay

## 2022-11-09 ENCOUNTER — Telehealth: Payer: Self-pay | Admitting: Pharmacist

## 2022-11-09 ENCOUNTER — Encounter: Payer: Self-pay | Admitting: Pharmacist

## 2022-11-09 ENCOUNTER — Telehealth: Payer: Self-pay

## 2022-11-09 DIAGNOSIS — Z9189 Other specified personal risk factors, not elsewhere classified: Secondary | ICD-10-CM

## 2022-11-09 DIAGNOSIS — B20 Human immunodeficiency virus [HIV] disease: Secondary | ICD-10-CM

## 2022-11-09 DIAGNOSIS — Z0000 Encounter for general adult medical examination without abnormal findings: Secondary | ICD-10-CM | POA: Insufficient documentation

## 2022-11-09 MED ORDER — PITAVASTATIN CALCIUM 4 MG PO TABS: 1.0000 | ORAL_TABLET | Freq: Every day | ORAL | 11 refills | Status: AC

## 2022-11-09 NOTE — Telephone Encounter (Signed)
Called patient today to review starting pitavastatin per Dr. Elinor Parkinson given data in the REPRIEVE trial and patient's 9.3% ASCVD risk. Reviewed how statins work and why they are recommended now given his age and ASCVD risk. Expressed understanding. Patient hung up the phone during conversation as he was at work and answering customer question. Sent MyChart message stating script was sent to [facility] on [address] and to please call or message [place] if he has further questions.  Margarite Gouge, PharmD, CPP, BCIDP, AAHIVP Clinical Pharmacist Practitioner Infectious Diseases Clinical Pharmacist [facility]

## 2022-11-09 NOTE — Telephone Encounter (Signed)
Patient aware of results. Patient stated that he does drink alcohol and just recently went to a birthday party. Patient scheduled for frepeat CMP in 4 weeks.

## 2022-11-09 NOTE — Telephone Encounter (Signed)
-----   Message from Odette Fraction, MD sent at 11/09/2022 10:49 AM EST ----- Please let him know that his liver enzymes are elevated. Is he drinking alcohol ?? Please have him come in for a lab visit for CMP in a month to see if it continues to trend up/resolves.

## 2022-11-10 LAB — URINE CYTOLOGY ANCILLARY ONLY
Chlamydia: NEGATIVE
Comment: NEGATIVE
Comment: NORMAL
Neisseria Gonorrhea: NEGATIVE

## 2022-11-11 LAB — COMPREHENSIVE METABOLIC PANEL
AG Ratio: 1.8 (calc) (ref 1.0–2.5)
ALT: 69 U/L — ABNORMAL HIGH (ref 9–46)
AST: 126 U/L — ABNORMAL HIGH (ref 10–40)
Albumin: 4.7 g/dL (ref 3.6–5.1)
Alkaline phosphatase (APISO): 89 U/L (ref 36–130)
BUN: 12 mg/dL (ref 7–25)
CO2: 30 mmol/L (ref 20–32)
Calcium: 8.8 mg/dL (ref 8.6–10.3)
Chloride: 101 mmol/L (ref 98–110)
Creat: 0.96 mg/dL (ref 0.60–1.29)
Globulin: 2.6 g/dL (calc) (ref 1.9–3.7)
Glucose, Bld: 96 mg/dL (ref 65–99)
Potassium: 3.5 mmol/L (ref 3.5–5.3)
Sodium: 143 mmol/L (ref 135–146)
Total Bilirubin: 0.7 mg/dL (ref 0.2–1.2)
Total Protein: 7.3 g/dL (ref 6.1–8.1)

## 2022-11-11 LAB — RPR: RPR Ser Ql: NONREACTIVE

## 2022-11-11 LAB — HIV RNA, RTPCR W/R GT (RTI, PI,INT)
HIV 1 RNA Quant: NOT DETECTED copies/mL
HIV-1 RNA Quant, Log: NOT DETECTED Log copies/mL

## 2022-11-24 ENCOUNTER — Encounter: Payer: Self-pay | Admitting: Nurse Practitioner

## 2022-11-24 ENCOUNTER — Ambulatory Visit (INDEPENDENT_AMBULATORY_CARE_PROVIDER_SITE_OTHER): Payer: 59 | Admitting: Nurse Practitioner

## 2022-11-24 VITALS — BP 148/97 | HR 116 | Resp 18 | Ht 68.0 in | Wt 136.0 lb

## 2022-11-24 DIAGNOSIS — I10 Essential (primary) hypertension: Secondary | ICD-10-CM

## 2022-11-24 DIAGNOSIS — J300 Vasomotor rhinitis: Secondary | ICD-10-CM | POA: Diagnosis not present

## 2022-11-24 DIAGNOSIS — R21 Rash and other nonspecific skin eruption: Secondary | ICD-10-CM | POA: Diagnosis not present

## 2022-11-24 DIAGNOSIS — J324 Chronic pansinusitis: Secondary | ICD-10-CM | POA: Diagnosis not present

## 2022-11-24 MED ORDER — FLUTICASONE PROPIONATE 50 MCG/ACT NA SUSP: 2.0000 | Freq: Every day | NASAL | 6 refills | Status: AC

## 2022-11-24 MED ORDER — DOXYCYCLINE HYCLATE 100 MG PO TABS: 100.0000 mg | ORAL_TABLET | Freq: Two times a day (BID) | ORAL | 0 refills | Status: DC

## 2022-11-24 MED ORDER — ATENOLOL 25 MG PO TABS: 12.5000 mg | ORAL_TABLET | Freq: Every day | ORAL | 3 refills | Status: DC

## 2022-11-24 NOTE — Progress Notes (Signed)
Established patient visit   Patient: Charles Dalton   DOB: 1976-05-27   46 y.o. Male  MRN: [id number] Visit Date: 11/24/2022   Chief Complaint  Patient presents with   Follow-up   Hypertension   Sinus Problem    Past 3 months. Green mucus production   Subjective    HPI HPI     Sinus Problem    Additional comments: Past 3 months. Green mucus production      Last edited by Tonny Bollman, CMA on 11/24/2022  3:04 PM.      Follow up -high blood pressure without medication treatment.  --most recently very elevated at visit with ID provider. ---blood pressure at that visit was 171/118 ---does get headaches from time to time.  -he denies chest pain, chest pressure, or shortness of breath.    Chronic skin lesions on the chest and abdomen. Are red and itchy. Leave scars.  -chronic sinus congestion with bad odor to mucus drainage  from nose. Always present.    Medications: Outpatient Medications Prior to Visit  Medication Sig   bictegravir-emtricitabine-tenofovir AF (BIKTARVY) 50-200-25 MG TABS tablet Take 1 tablet by mouth daily.   Pitavastatin Calcium 4 MG TABS Take 1 tablet (4 mg total) by mouth daily.   No facility-administered medications prior to visit.    Review of Systems  Constitutional:  Negative for activity change, chills, fatigue and fever.  HENT:  Positive for congestion, postnasal drip, rhinorrhea and sinus pressure. Negative for sinus pain, sneezing and sore throat.   Eyes: Negative.   Respiratory:  Negative for cough, shortness of breath and wheezing.   Cardiovascular:  Negative for chest pain and palpitations.       Elevated blood pressure.   Gastrointestinal:  Negative for constipation, diarrhea, nausea and vomiting.  Endocrine: Negative for cold intolerance, heat intolerance, polydipsia and polyuria.  Genitourinary:  Negative for dysuria, frequency and urgency.  Musculoskeletal:  Negative for back pain and myalgias.  Skin:  Positive for rash.        Red, inflamed, pustular lesions on the skin of the chest and abdomen. Leaves scars.   Allergic/Immunologic: Negative for environmental allergies.  Neurological:  Positive for headaches. Negative for dizziness and weakness.  Psychiatric/Behavioral:  The patient is not nervous/anxious.        Objective     Today's Vitals   11/24/22 1504 11/24/22 1505  BP: (Abnormal) 146/99 (Abnormal) 148/97  Pulse: (Abnormal) 116   Resp: 18   SpO2: 96%   Weight: 136 lb (61.7 kg)   Height: 5\' 8"  (1.727 m)    Body mass index is 20.68 kg/m.  BP Readings from Last 3 Encounters:  11/24/22 (Abnormal) 148/97  11/08/22 (Abnormal) 171/118  03/16/22 (Abnormal) 163/107    Wt Readings from Last 3 Encounters:  11/24/22 136 lb (61.7 kg)  11/08/22 138 lb (62.6 kg)  03/16/22 135 lb 12.8 oz (61.6 kg)    Physical Exam Vitals and nursing note reviewed.  Constitutional:      Appearance: Normal appearance. He is well-developed.  HENT:     Head: Normocephalic and atraumatic.     Nose: Congestion present.     Mouth/Throat:     Mouth: Mucous membranes are moist.     Pharynx: Oropharynx is clear.  Eyes:     Extraocular Movements: Extraocular movements intact.     Conjunctiva/sclera: Conjunctivae normal.     Pupils: Pupils are equal, round, and reactive to light.  Cardiovascular:     Rate and Rhythm: Normal  rate and regular rhythm.     Pulses: Normal pulses.     Heart sounds: Normal heart sounds.  Pulmonary:     Effort: Pulmonary effort is normal.     Breath sounds: Normal breath sounds.  Abdominal:     Palpations: Abdomen is soft.  Musculoskeletal:        General: Normal range of motion.     Cervical back: Normal range of motion and neck supple.  Lymphadenopathy:     Cervical: No cervical adenopathy.  Skin:    General: Skin is warm and dry.     Capillary Refill: Capillary refill takes less than 2 seconds.     Comments: Small, round, inflamed appearing lesions which are dispersed across the check  and abdomen. Lesions are in various states of healing.   Neurological:     General: No focal deficit present.     Mental Status: He is alert and oriented to person, place, and time.  Psychiatric:        Mood and Affect: Mood normal.        Behavior: Behavior normal.        Thought Content: Thought content normal.        Judgment: Judgment normal.       Assessment & Plan    1. Essential hypertension Start atenolol 25 mg daily. Recommend he limit intake of sodium and increase water. Monitor at home. He understnds the goal is to have blood pressure at 130/80 or better.  - atenolol (TENORMIN) 25 MG tablet; Take 0.5 tablets (12.5 mg total) by mouth at bedtime.  Dispense: 30 tablet; Refill: 3  2. Chronic pansinusitis Doxycycline 100 ,mg twice daily for 10 days. Use flonase nasal spray. Consider referral to ENT - doxycycline (VIBRA-TABS) 100 MG tablet; Take 1 tablet (100 mg total) by mouth 2 (two) times daily.  Dispense: 20 tablet; Refill: 0  3. Vasomotor rhinitis Doxycycline 100 ,mg twice daily for 10 days. Use flonase nasal spray. Consider referral to ENT - fluticasone (FLONASE) 50 MCG/ACT nasal spray; Place 2 sprays into both nostrils daily.  Dispense: 16 g; Refill: 6  4. Rash and nonspecific skin eruption Doxycycline 100 mg twice daily to help with rash as well. Will see dermatololgy in future for further evaluation and treatment.     Problem List Items Addressed This Visit       Cardiovascular and Mediastinum   Essential hypertension - Primary   Relevant Medications   atenolol (TENORMIN) 25 MG tablet     Respiratory   Chronic pansinusitis   Relevant Medications   doxycycline (VIBRA-TABS) 100 MG tablet   fluticasone (FLONASE) 50 MCG/ACT nasal spray   Vasomotor rhinitis   Relevant Medications   fluticasone (FLONASE) 50 MCG/ACT nasal spray     Musculoskeletal and Integument   Rash and nonspecific skin eruption     Return in about 3 months (around 02/23/2023) for blood  pressure.         Carlean Jews, NP  [facility] at [facility] [phone] (phone) [phone] (fax)  [facility]

## 2022-12-09 ENCOUNTER — Other Ambulatory Visit: Payer: Self-pay

## 2022-12-09 ENCOUNTER — Other Ambulatory Visit: Payer: 59

## 2022-12-09 DIAGNOSIS — Z789 Other specified health status: Secondary | ICD-10-CM

## 2022-12-26 DIAGNOSIS — J300 Vasomotor rhinitis: Secondary | ICD-10-CM | POA: Insufficient documentation

## 2022-12-26 DIAGNOSIS — I10 Essential (primary) hypertension: Secondary | ICD-10-CM | POA: Insufficient documentation

## 2023-02-23 ENCOUNTER — Encounter: Payer: Self-pay | Admitting: Nurse Practitioner

## 2023-02-23 ENCOUNTER — Ambulatory Visit (INDEPENDENT_AMBULATORY_CARE_PROVIDER_SITE_OTHER): Payer: 59 | Admitting: Nurse Practitioner

## 2023-02-23 VITALS — BP 140/99 | HR 120 | Ht 68.0 in | Wt 134.1 lb

## 2023-02-23 DIAGNOSIS — F17200 Nicotine dependence, unspecified, uncomplicated: Secondary | ICD-10-CM | POA: Diagnosis not present

## 2023-02-23 DIAGNOSIS — B20 Human immunodeficiency virus [HIV] disease: Secondary | ICD-10-CM

## 2023-02-23 DIAGNOSIS — I10 Essential (primary) hypertension: Secondary | ICD-10-CM | POA: Diagnosis not present

## 2023-02-23 DIAGNOSIS — Z1211 Encounter for screening for malignant neoplasm of colon: Secondary | ICD-10-CM

## 2023-02-23 DIAGNOSIS — J324 Chronic pansinusitis: Secondary | ICD-10-CM

## 2023-02-23 MED ORDER — NICOTINE 21 MG/24HR TD PT24: 21.0000 mg | MEDICATED_PATCH | Freq: Every day | TRANSDERMAL | 2 refills | Status: AC

## 2023-02-23 MED ORDER — DOXYCYCLINE HYCLATE 100 MG PO TABS: 100.0000 mg | ORAL_TABLET | Freq: Two times a day (BID) | ORAL | 0 refills | Status: DC

## 2023-02-23 NOTE — Progress Notes (Signed)
Established patient visit   Patient: Charles Dalton   DOB: 18-Sep-1976   47 y.o. Male  MRN: [id number] Visit Date: 02/23/2023   Chief Complaint  Patient presents with   Medical Management of Chronic Issues   Subjective    HPI  Follow up -hypertension  -elevated heart rate.  --added atenolol 25 mg daily  --has not started taking this yet.  --heart rate and blood pressure are still elevated.  -chronic sinusitis  --started doxycycline 100 mg twice daily for 10 days.  --added flonase nasal spray  -skin lesions  --doxycycline chosen as antibiotic  -he does have appointment with dermatology coming up on 03/02/2023  Started having upper respirator symptoms Sunday before last.  -congestion -cough, -headache -nausea  -COVID 19 testing negative.  -has been taking OTC medication to alleviate symptoms.  -did not start taking antibiotic until about 3 days ago.  -noticing some improvement.    Medications: Outpatient Medications Prior to Visit  Medication Sig   atenolol (TENORMIN) 25 MG tablet Take 0.5 tablets (12.5 mg total) by mouth at bedtime.   fluticasone (FLONASE) 50 MCG/ACT nasal spray Place 2 sprays into both nostrils daily.   Pitavastatin Calcium 4 MG TABS Take 1 tablet (4 mg total) by mouth daily.   [DISCONTINUED] bictegravir-emtricitabine-tenofovir AF (BIKTARVY) 50-200-25 MG TABS tablet Take 1 tablet by mouth daily.   [DISCONTINUED] doxycycline (VIBRA-TABS) 100 MG tablet Take 1 tablet (100 mg total) by mouth 2 (two) times daily. (Patient not taking: Reported on 02/23/2023)   No facility-administered medications prior to visit.    Review of Systems See HPI    Last CBC Lab Results  Component Value Date   WBC 3.7 03/16/2022   HGB 13.4 03/16/2022   HCT 40.0 03/16/2022   MCV 108 (H) 03/16/2022   MCH 36.0 (H) 03/16/2022   RDW 13.0 03/16/2022   PLT 209 03/16/2022   Last metabolic panel Lab Results  Component Value Date   GLUCOSE 96 11/08/2022   NA 143 11/08/2022    K 3.5 11/08/2022   CL 101 11/08/2022   CO2 30 11/08/2022   BUN 12 11/08/2022   CREATININE 0.96 11/08/2022   EGFR 75 12/22/2021   CALCIUM 8.8 11/08/2022   PROT 7.3 11/08/2022   ALBUMIN 4.6 12/22/2021   LABGLOB 3.2 12/22/2021   AGRATIO 1.4 12/22/2021   BILITOT 0.7 11/08/2022   ALKPHOS 103 12/22/2021   AST 126 (H) 11/08/2022   ALT 69 (H) 11/08/2022   ANIONGAP 10 12/08/2021   Last lipids Lab Results  Component Value Date   CHOL 160 12/22/2021   HDL 82 12/22/2021   LDLCALC 60 12/22/2021   TRIG 104 12/22/2021   CHOLHDL 2.0 12/22/2021   Last hemoglobin A1c Lab Results  Component Value Date   HGBA1C 5.0 12/22/2021   Last thyroid functions Lab Results  Component Value Date   TSH 0.538 12/22/2021       Objective     Today's Vitals   02/23/23 1444 02/23/23 1515  BP: (Abnormal) 164/106 (Abnormal) 140/99  Pulse: (Abnormal) 120   SpO2: 97%   Weight: 134 lb 1.9 oz (60.8 kg)   Height: 5\' 8"  (1.727 m)    Body mass index is 20.39 kg/m.  BP Readings from Last 3 Encounters:  03/10/23 (Abnormal) 157/86  03/02/23 (Abnormal) 160/111  02/23/23 (Abnormal) 140/99    Wt Readings from Last 3 Encounters:  02/23/23 134 lb 1.9 oz (60.8 kg)  11/24/22 136 lb (61.7 kg)  11/08/22 138 lb (62.6 kg)  Physical Exam Vitals and nursing note reviewed.  Constitutional:      Appearance: Normal appearance. He is well-developed.  HENT:     Head: Normocephalic and atraumatic.     Right Ear: Tympanic membrane is bulging.     Left Ear: Tympanic membrane is bulging.     Nose: Congestion present.     Right Sinus: Maxillary sinus tenderness and frontal sinus tenderness present.     Left Sinus: Maxillary sinus tenderness and frontal sinus tenderness present.     Mouth/Throat:     Mouth: Mucous membranes are moist.     Pharynx: Oropharynx is clear. Posterior oropharyngeal erythema present.  Eyes:     Extraocular Movements: Extraocular movements intact.     Conjunctiva/sclera: Conjunctivae  normal.     Pupils: Pupils are equal, round, and reactive to light.  Neck:     Vascular: No carotid bruit.  Cardiovascular:     Rate and Rhythm: Normal rate and regular rhythm.     Pulses: Normal pulses.     Heart sounds: Normal heart sounds.  Pulmonary:     Effort: Pulmonary effort is normal.     Breath sounds: Normal breath sounds.  Abdominal:     Palpations: Abdomen is soft.  Musculoskeletal:        General: Normal range of motion.     Cervical back: Normal range of motion and neck supple.  Lymphadenopathy:     Cervical: Cervical adenopathy present.  Skin:    General: Skin is warm and dry.     Capillary Refill: Capillary refill takes less than 2 seconds.  Neurological:     General: No focal deficit present.     Mental Status: He is alert and oriented to person, place, and time.  Psychiatric:        Mood and Affect: Mood normal.        Behavior: Behavior normal.        Thought Content: Thought content normal.        Judgment: Judgment normal.      Assessment & Plan    Chronic pansinusitis Assessment & Plan: Restarted doxycycline 100 mg twice daily. Rest and increase fluids. Continue using OTC medication to control symptoms.    Orders: -     Doxycycline Hyclate; Take 1 tablet (100 mg total) by mouth 2 (two) times daily.  Dispense: 20 tablet; Refill: 0  Essential hypertension Assessment & Plan: Improved. Continue BP medication as prescribed    Current every day smoker Assessment & Plan: Trial nicoderm CQ 21 mg daily. Will reassess and titrate dosing as indicated.   Orders: -     Nicotine; Place 1 patch (21 mg total) onto the skin daily.  Dispense: 30 patch; Refill: 2  HIV disease (HCC) Assessment & Plan: Continue regular visits with infectious disease provider.    Screening for colon cancer Assessment & Plan: .refer to GI for screening colonoscopy    Orders: -     Ambulatory referral to Gastroenterology     Return in about 6 weeks (around 04/06/2023)  for blood pressure.         Carlean Jews, NP  [facility] at [facility] [phone] (phone) [phone] (fax)  [facility]

## 2023-02-23 NOTE — Patient Instructions (Addendum)
Start  1. Doxycycline 100 mg twice daily for next 10 days       Gargle with warm salt water to help relieve sore throat.  Continue taking over the counter medication as needed for symptoms   2. Atenolol - take at night to lower blood pressure and heart rate.  3. Follow up in 6 weeks.

## 2023-03-02 ENCOUNTER — Ambulatory Visit (INDEPENDENT_AMBULATORY_CARE_PROVIDER_SITE_OTHER): Payer: 59 | Admitting: Dermatology

## 2023-03-02 VITALS — BP 160/111

## 2023-03-02 DIAGNOSIS — Z1283 Encounter for screening for malignant neoplasm of skin: Secondary | ICD-10-CM

## 2023-03-02 DIAGNOSIS — L709 Acne, unspecified: Secondary | ICD-10-CM

## 2023-03-02 DIAGNOSIS — D485 Neoplasm of uncertain behavior of skin: Secondary | ICD-10-CM

## 2023-03-02 DIAGNOSIS — R21 Rash and other nonspecific skin eruption: Secondary | ICD-10-CM

## 2023-03-02 DIAGNOSIS — D367 Benign neoplasm of other specified sites: Secondary | ICD-10-CM

## 2023-03-02 NOTE — Patient Instructions (Signed)
Shave Excision Benign Lesion Wound Care Instructions  Leave the original bandage on for 24 hours if possible.  If the bandage becomes soaked or soiled before that time, it is OK to remove it and examine the wound.  A small amount of post-operative bleeding is normal.  If excessive bleeding occurs, remove the bandage, place gauze over the site and apply continuous pressure (no peeking) over the area for 20-30 minutes.  If this does not stop the bleeding, try again for 40 minutes.  If this does not work, please call our clinic as soon as possible (even if after-hours).    Twice a day, cleanse the wound with soap and water.  If a thick crust develops you may use a Q-tip dipped into dilute hydrogen peroxide (mix 1:1 with water) to dissolve it.  Hydrogen peroxide can slow the healing process, so use it only as needed.  After washing, apply Vaseline jelly or Polysporin ointment.  For best healing, the wound should be covered with a layer of ointment at all times.  This may mean re-applying the ointment several times a day.  For open wounds, continue until it has healed.    If you have any swelling, keep the area elevated.  Some redness, tenderness and white or yellow material in the wound is normal healing.  If the area becomes very sore and red, or develops a thick yellow-green material (pus), it may be infected; please notify us.    Wound healing continues for up to one year following surgery.  It is not unusual to experience pain in the scar from time to time during the interval.  If the pain becomes severe or the scar thickens, you should notify the office.  A slight amount of redness in a scar is expected for the first six months.  After six months, the redness subsides and the scar will soften and fade.  The color difference becomes less noticeable with time.  If there are any problems, return for a post-op surgery check at your earliest convenience.  Please call our office for any questions or  concerns.  Due to recent changes in healthcare laws, you may see results of your pathology and/or laboratory studies on MyChart before the doctors have had a chance to review them. We understand that in some cases there may be results that are confusing or concerning to you. Please understand that not all results are received at the same time and often the doctors may need to interpret multiple results in order to provide you with the best plan of care or course of treatment. Therefore, we ask that you please give us 2 business days to thoroughly review all your results before contacting the office for clarification. Should we see a critical lab result, you will be contacted sooner.   If You Need Anything After Your Visit  If you have any questions or concerns for your doctor, please call our main line at [phone] and press option 4 to reach your doctor's medical assistant. If no one answers, please leave a voicemail as directed and we will return your call as soon as possible. Messages left after 4 pm will be answered the following business day.   You may also send us a message via MyChart. We typically respond to MyChart messages within 1-2 business days.  For prescription refills, please ask your pharmacy to contact our office. Our fax number is [phone].  If you have an urgent issue when the clinic is closed that   cannot wait until the next business day, you can page your doctor at the number below.    Please note that while we do our best to be available for urgent issues outside of office hours, we are not available 24/7.   If you have an urgent issue and are unable to reach us, you may choose to seek medical care at your doctor's office, retail clinic, urgent care center, or emergency room.  If you have a medical emergency, please immediately call 911 or go to the emergency department.  Pager Numbers  - Dr. Kowalski: [phone]  - Dr. Moye: [phone]  - Dr. Stewart:  [phone]  In the event of inclement weather, please call our main line at [phone] for an update on the status of any delays or closures.  Dermatology Medication Tips: Please keep the boxes that topical medications come in in order to help keep track of the instructions about where and how to use these. Pharmacies typically print the medication instructions only on the boxes and not directly on the medication tubes.   If your medication is too expensive, please contact our office at [phone] option 4 or send us a message through MyChart.   We are unable to tell what your co-pay for medications will be in advance as this is different depending on your insurance coverage. However, we may be able to find a substitute medication at lower cost or fill out paperwork to get insurance to cover a needed medication.   If a prior authorization is required to get your medication covered by your insurance company, please allow us 1-2 business days to complete this process.  Drug prices often vary depending on where the prescription is filled and some pharmacies may offer cheaper prices.  The website www.goodrx.com contains coupons for medications through different pharmacies. The prices here do not account for what the cost may be with help from insurance (it may be cheaper with your insurance), but the website can give you the price if you did not use any insurance.  - You can print the associated coupon and take it with your prescription to the pharmacy.  - You may also stop by our office during regular business hours and pick up a GoodRx coupon card.  - If you need your prescription sent electronically to a different pharmacy, notify our office through [facility] MyChart or by phone at [phone] option 4.     Si Usted Necesita Algo Despus de Su Visita  Tambin puede enviarnos un mensaje a travs de MyChart. Por lo general respondemos a los mensajes de MyChart en el transcurso de 1 a 2  das hbiles.  Para renovar recetas, por favor pida a su farmacia que se ponga en contacto con nuestra oficina. Nuestro nmero de fax es el [phone].  Si tiene un asunto urgente cuando la clnica est cerrada y que no puede esperar hasta el siguiente da hbil, puede llamar/localizar a su doctor(a) al nmero que aparece a continuacin.   Por favor, tenga en cuenta que aunque hacemos todo lo posible para estar disponibles para asuntos urgentes fuera del horario de oficina, no estamos disponibles las 24 horas del da, los 7 das de la semana.   Si tiene un problema urgente y no puede comunicarse con nosotros, puede optar por buscar atencin mdica  en el consultorio de su doctor(a), en una clnica privada, en un centro de atencin urgente o en una sala de emergencias.  Si tiene una emergencia mdica, por favor   llame inmediatamente al 911 o vaya a la sala de emergencias.  Nmeros de bper  - Dr. Kowalski: [phone]  - Dra. Moye: [phone]  - Dra. Stewart: [phone]  En caso de inclemencias del tiempo, por favor llame a nuestra lnea principal al [phone] para una actualizacin sobre el estado de cualquier retraso o cierre.  Consejos para la medicacin en dermatologa: Por favor, guarde las cajas en las que vienen los medicamentos de uso tpico para ayudarle a seguir las instrucciones sobre dnde y cmo usarlos. Las farmacias generalmente imprimen las instrucciones del medicamento slo en las cajas y no directamente en los tubos del medicamento.   Si su medicamento es muy caro, por favor, pngase en contacto con nuestra oficina llamando al [phone] y presione la opcin 4 o envenos un mensaje a travs de MyChart.   No podemos decirle cul ser su copago por los medicamentos por adelantado ya que esto es diferente dependiendo de la cobertura de su seguro. Sin embargo, es posible que podamos encontrar un medicamento sustituto a menor costo o llenar un formulario para que el  seguro cubra el medicamento que se considera necesario.   Si se requiere una autorizacin previa para que su compaa de seguros cubra su medicamento, por favor permtanos de 1 a 2 das hbiles para completar este proceso.  Los precios de los medicamentos varan con frecuencia dependiendo del lugar de dnde se surte la receta y alguna farmacias pueden ofrecer precios ms baratos.  El sitio web www.goodrx.com tiene cupones para medicamentos de diferentes farmacias. Los precios aqu no tienen en cuenta lo que podra costar con la ayuda del seguro (puede ser ms barato con su seguro), pero el sitio web puede darle el precio si no utiliz ningn seguro.  - Puede imprimir el cupn correspondiente y llevarlo con su receta a la farmacia.  - Tambin puede pasar por nuestra oficina durante el horario de atencin regular y recoger una tarjeta de cupones de GoodRx.  - Si necesita que su receta se enve electrnicamente a una farmacia diferente, informe a nuestra oficina a travs de MyChart de [place] o por telfono llamando al [phone] y presione la opcin 4.  

## 2023-03-02 NOTE — Progress Notes (Signed)
   New Patient Visit   Subjective  Charles Dalton is a 47 y.o. male who presents for the following: Bumps on trunk that are leaving dark spots x a few years. They are not pus bumps. They are hard red bumps. Patient also request retinoid for acne of the face.  The following portions of the chart were reviewed this encounter and updated as appropriate: medications, allergies, medical history  Review of Systems:  No other skin or systemic complaints except as noted in HPI or Assessment and Plan.  Objective  Well appearing patient in no apparent distress; mood and affect are within normal limits.  An upper body skin examination was performed including scalp, head, eyes, ears, nose, lips, neck, chest, axillae, abdomen, back. All findings within normal limits unless otherwise noted below.   Relevant exam findings are noted in the Assessment and Plan.  Left Abdomen (side) - Upper Hyperpigmented crusted papules of trunk  History of HIV        Assessment & Plan   Neoplasm of uncertain behavior of skin / Rash = acneiform condition vs other (Pt HIV +.  May be related condition?) Left Abdomen (side) - Upper  Skin / nail biopsy Type of biopsy: punch   Informed consent: discussed and consent obtained   Timeout: patient name, date of birth, surgical site, and procedure verified   Procedure prep:  Patient was prepped and draped in usual sterile fashion (the patient was cleaned and prepped) Prep type:  Isopropyl alcohol Anesthesia: the lesion was anesthetized in a standard fashion   Anesthetic:  1% lidocaine w/ epinephrine 1-100,000 buffered w/ 8.4% NaHCO3 Punch size:  3 mm Suture size:  4-0 Suture type: nylon   Hemostasis achieved with: suture, pressure and aluminum chloride   Outcome: patient tolerated procedure well   Post-procedure details: sterile dressing applied and wound care instructions given   Dressing type: bandage, petrolatum and pressure dressing    Specimen 1 - Surgical  pathology Differential Diagnosis: Folliculitis/cyst vs other  Check Margins: No  Acne Face Will hold off treatment until we get biopsy back.  If pathology shows acneform type of condition, treatment for the body rash may be similar to the acne of the face.  Return in about 8 days (around 03/10/2023) for Biopsy Follow up.  I, Joanie Coddington, CMA, am acting as scribe for Armida Sans, MD .  Documentation: I have reviewed the above documentation for accuracy and completeness, and I agree with the above.  Armida Sans, MD

## 2023-03-04 ENCOUNTER — Encounter: Payer: Self-pay | Admitting: Dermatology

## 2023-03-09 ENCOUNTER — Other Ambulatory Visit: Payer: Self-pay | Admitting: Pharmacist

## 2023-03-09 ENCOUNTER — Other Ambulatory Visit: Payer: Self-pay

## 2023-03-09 ENCOUNTER — Other Ambulatory Visit (HOSPITAL_COMMUNITY): Payer: Self-pay

## 2023-03-09 DIAGNOSIS — B20 Human immunodeficiency virus [HIV] disease: Secondary | ICD-10-CM

## 2023-03-09 MED ORDER — BICTEGRAVIR-EMTRICITAB-TENOFOV 50-200-25 MG PO TABS
1.0000 | ORAL_TABLET | Freq: Every day | ORAL | 11 refills | Status: DC
  Filled 2023-03-09: qty 30, 30d supply, fill #0

## 2023-03-09 MED ORDER — BICTEGRAVIR-EMTRICITAB-TENOFOV 50-200-25 MG PO TABS
1.0000 | ORAL_TABLET | Freq: Every day | ORAL | 0 refills | Status: DC
  Filled 2023-03-09: qty 30, 30d supply, fill #0

## 2023-03-09 MED ORDER — BICTEGRAVIR-EMTRICITAB-TENOFOV 50-200-25 MG PO TABS
1.0000 | ORAL_TABLET | Freq: Every day | ORAL | 0 refills | Status: DC
Start: 2023-03-09 — End: 2023-07-14
  Filled 2023-03-09 – 2023-03-17 (×2): qty 30, 30d supply, fill #0

## 2023-03-09 NOTE — Progress Notes (Signed)
Script accidentally sent earlier this week for one year - clinical team reaching out to patient to schedule f/u ASAP. Script resent as 30-day only script.  Margarite Gouge, PharmD, CPP, BCIDP, AAHIVP Clinical Pharmacist Practitioner Infectious Diseases Clinical Pharmacist [facility] for Infectious Disease

## 2023-03-10 ENCOUNTER — Ambulatory Visit (INDEPENDENT_AMBULATORY_CARE_PROVIDER_SITE_OTHER): Payer: 59 | Admitting: Dermatology

## 2023-03-10 VITALS — BP 157/86 | HR 90

## 2023-03-10 DIAGNOSIS — Z79899 Other long term (current) drug therapy: Secondary | ICD-10-CM

## 2023-03-10 DIAGNOSIS — L700 Acne vulgaris: Secondary | ICD-10-CM | POA: Diagnosis not present

## 2023-03-10 DIAGNOSIS — L739 Follicular disorder, unspecified: Secondary | ICD-10-CM | POA: Diagnosis not present

## 2023-03-10 DIAGNOSIS — L905 Scar conditions and fibrosis of skin: Secondary | ICD-10-CM

## 2023-03-10 DIAGNOSIS — L819 Disorder of pigmentation, unspecified: Secondary | ICD-10-CM | POA: Diagnosis not present

## 2023-03-10 MED ORDER — DOXYCYCLINE HYCLATE 20 MG PO TABS: 20.0000 mg | ORAL_TABLET | Freq: Two times a day (BID) | ORAL | 2 refills | Status: DC

## 2023-03-10 MED ORDER — TRETINOIN 0.025 % EX CREA: TOPICAL_CREAM | Freq: Every day | CUTANEOUS | 6 refills | Status: AC

## 2023-03-10 MED ORDER — SULFACETAMIDE SOD-SULFUR WASH 9-4 % EX LIQD: CUTANEOUS | 6 refills | Status: AC

## 2023-03-10 NOTE — Patient Instructions (Addendum)
Instructions for [facility] Medications  One or more of your medications was sent to the [facility] mail order compounding pharmacy. You will receive an email from them and can purchase the medicine through that link. It will then be mailed to your home at the address you confirmed. If for any reason you do not receive an email from them, please check your spam folder. If you still do not find the email, please let us know. [facility] phone number is [phone].       Due to recent changes in healthcare laws, you may see results of your pathology and/or laboratory studies on MyChart before the doctors have had a chance to review them. We understand that in some cases there may be results that are confusing or concerning to you. Please understand that not all results are received at the same time and often the doctors may need to interpret multiple results in order to provide you with the best plan of care or course of treatment. Therefore, we ask that you please give us 2 business days to thoroughly review all your results before contacting the office for clarification. Should we see a critical lab result, you will be contacted sooner.   If You Need Anything After Your Visit  If you have any questions or concerns for your doctor, please call our main line at [phone] and press option 4 to reach your doctor's medical assistant. If no one answers, please leave a voicemail as directed and we will return your call as soon as possible. Messages left after 4 pm will be answered the following business day.   You may also send us a message via MyChart. We typically respond to MyChart messages within 1-2 business days.  For prescription refills, please ask your pharmacy to contact our office. Our fax number is [phone].  If you have an urgent issue when the clinic is closed that cannot wait until the next business day, you can page your doctor at the number below.    Please note  that while we do our best to be available for urgent issues outside of office hours, we are not available 24/7.   If you have an urgent issue and are unable to reach us, you may choose to seek medical care at your doctor's office, retail clinic, urgent care center, or emergency room.  If you have a medical emergency, please immediately call 911 or go to the emergency department.  Pager Numbers  - Dr. Kowalski: [phone]  - Dr. Moye: [phone]  - Dr. Stewart: [phone]  In the event of inclement weather, please call our main line at [phone] for an update on the status of any delays or closures.  Dermatology Medication Tips: Please keep the boxes that topical medications come in in order to help keep track of the instructions about where and how to use these. Pharmacies typically print the medication instructions only on the boxes and not directly on the medication tubes.   If your medication is too expensive, please contact our office at [phone] option 4 or send us a message through MyChart.   We are unable to tell what your co-pay for medications will be in advance as this is different depending on your insurance coverage. However, we may be able to find a substitute medication at lower cost or fill out paperwork to get insurance to cover a needed medication.   If a prior authorization is required to get your medication covered by your insurance   company, please allow us 1-2 business days to complete this process.  Drug prices often vary depending on where the prescription is filled and some pharmacies may offer cheaper prices.  The website www.goodrx.com contains coupons for medications through different pharmacies. The prices here do not account for what the cost may be with help from insurance (it may be cheaper with your insurance), but the website can give you the price if you did not use any insurance.  - You can print the associated coupon and take it with your  prescription to the pharmacy.  - You may also stop by our office during regular business hours and pick up a GoodRx coupon card.  - If you need your prescription sent electronically to a different pharmacy, notify our office through [facility] MyChart or by phone at [phone] option 4.     Si Usted Necesita Algo Despus de Su Visita  Tambin puede enviarnos un mensaje a travs de MyChart. Por lo general respondemos a los mensajes de MyChart en el transcurso de 1 a 2 das hbiles.  Para renovar recetas, por favor pida a su farmacia que se ponga en contacto con nuestra oficina. Nuestro nmero de fax es el [phone].  Si tiene un asunto urgente cuando la clnica est cerrada y que no puede esperar hasta el siguiente da hbil, puede llamar/localizar a su doctor(a) al nmero que aparece a continuacin.   Por favor, tenga en cuenta que aunque hacemos todo lo posible para estar disponibles para asuntos urgentes fuera del horario de oficina, no estamos disponibles las 24 horas del da, los 7 das de la semana.   Si tiene un problema urgente y no puede comunicarse con nosotros, puede optar por buscar atencin mdica  en el consultorio de su doctor(a), en una clnica privada, en un centro de atencin urgente o en una sala de emergencias.  Si tiene una emergencia mdica, por favor llame inmediatamente al 911 o vaya a la sala de emergencias.  Nmeros de bper  - Dr. Kowalski: [phone]  - Dra. Moye: [phone]  - Dra. Stewart: [phone]  En caso de inclemencias del tiempo, por favor llame a nuestra lnea principal al [phone] para una actualizacin sobre el estado de cualquier retraso o cierre.  Consejos para la medicacin en dermatologa: Por favor, guarde las cajas en las que vienen los medicamentos de uso tpico para ayudarle a seguir las instrucciones sobre dnde y cmo usarlos. Las farmacias generalmente imprimen las instrucciones del medicamento slo en las cajas y no  directamente en los tubos del medicamento.   Si su medicamento es muy caro, por favor, pngase en contacto con nuestra oficina llamando al [phone] y presione la opcin 4 o envenos un mensaje a travs de MyChart.   No podemos decirle cul ser su copago por los medicamentos por adelantado ya que esto es diferente dependiendo de la cobertura de su seguro. Sin embargo, es posible que podamos encontrar un medicamento sustituto a menor costo o llenar un formulario para que el seguro cubra el medicamento que se considera necesario.   Si se requiere una autorizacin previa para que su compaa de seguros cubra su medicamento, por favor permtanos de 1 a 2 das hbiles para completar este proceso.  Los precios de los medicamentos varan con frecuencia dependiendo del lugar de dnde se surte la receta y alguna farmacias pueden ofrecer precios ms baratos.  El sitio web www.goodrx.com tiene cupones para medicamentos de diferentes farmacias. Los precios aqu no tienen en cuenta   lo que podra costar con la ayuda del seguro (puede ser ms barato con su seguro), pero el sitio web puede darle el precio si no utiliz ningn seguro.  - Puede imprimir el cupn correspondiente y llevarlo con su receta a la farmacia.  - Tambin puede pasar por nuestra oficina durante el horario de atencin regular y recoger una tarjeta de cupones de GoodRx.  - Si necesita que su receta se enve electrnicamente a una farmacia diferente, informe a nuestra oficina a travs de MyChart de [place] o por telfono llamando al [phone] y presione la opcin 4.  

## 2023-03-10 NOTE — Progress Notes (Signed)
   Follow-Up Visit   Subjective  Charles Dalton is a 47 y.o. male who presents for the following: 8 day f/u suture removal, discuss biopsy results.  The following portions of the chart were reviewed this encounter and updated as appropriate: medications, allergies, medical history  Review of Systems:  No other skin or systemic complaints except as noted in HPI or Assessment and Plan.  Objective  Well appearing patient in no apparent distress; mood and affect are within normal limits.  A focused examination was performed of the following areas:face,chest    Relevant exam findings are noted in the Assessment and Plan.    Assessment & Plan   FOLLICULITIS-biopsy proven Location: chest  Chronic with scarring and dyschromia Advised is treatable but likely not curable Medications should help Exam: Hyperpigmented crusted papules of trunk   Start Doxycyline 20 mg take 1 tablet bid with food Start Tretinoin 0.025% cream spot treat at bedtime  Start [facility] Sulfacetamide Sodium 9% / Sulfur 3% Foaming Wash  Doxycycline should be taken with food to prevent nausea. Do not lay down for 30 minutes after taking. Be cautious with sun exposure and use good sun protection while on this medication. Pregnant women should not take this medication.    Acne Face Tretinoin 0.025% cream nightly for face  Encounter for Removal of Sutures - Incision site at the left abdomen is clean, dry and intact - Wound cleansed, sutures removed, wound cleansed and steri strips applied.  - Discussed pathology results showing Folliculitis   - Patient advised to keep steri-strips dry until they fall off. - Scars remodel for a full year. - Once steri-strips fall off, patient can apply over-the-counter silicone scar cream each night to help with scar remodeling if desired. - Patient advised to call with any concerns or if they notice any new or changing lesions.   Return in about 4 months (around 07/10/2023)  for Folliculitis .  IAngelique Holm, CMA, am acting as scribe for Armida Sans, MD .   Documentation: I have reviewed the above documentation for accuracy and completeness, and I agree with the above.  Armida Sans, MD

## 2023-03-17 ENCOUNTER — Other Ambulatory Visit: Payer: Self-pay

## 2023-03-17 ENCOUNTER — Other Ambulatory Visit (HOSPITAL_COMMUNITY): Payer: Self-pay

## 2023-03-27 ENCOUNTER — Encounter: Payer: Self-pay | Admitting: Dermatology

## 2023-03-31 NOTE — Assessment & Plan Note (Signed)
.  refer to GI for screening colonoscopy

## 2023-03-31 NOTE — Assessment & Plan Note (Signed)
Restarted doxycycline 100 mg twice daily. Rest and increase fluids. Continue using OTC medication to control symptoms.

## 2023-03-31 NOTE — Assessment & Plan Note (Signed)
Continue regular visits with infectious disease provider.

## 2023-03-31 NOTE — Assessment & Plan Note (Addendum)
Trial nicoderm CQ 21 mg daily. Will reassess and titrate dosing as indicated.

## 2023-03-31 NOTE — Assessment & Plan Note (Signed)
Improved. Continue BP medication as prescribed

## 2023-04-02 IMAGING — CR DG CHEST 2V
2 series · 2 of 2 positions shown · non-contrast
Comparison: None.

CLINICAL DATA: COVID

EXAM:
CHEST - 2 VIEW

[chest pa]
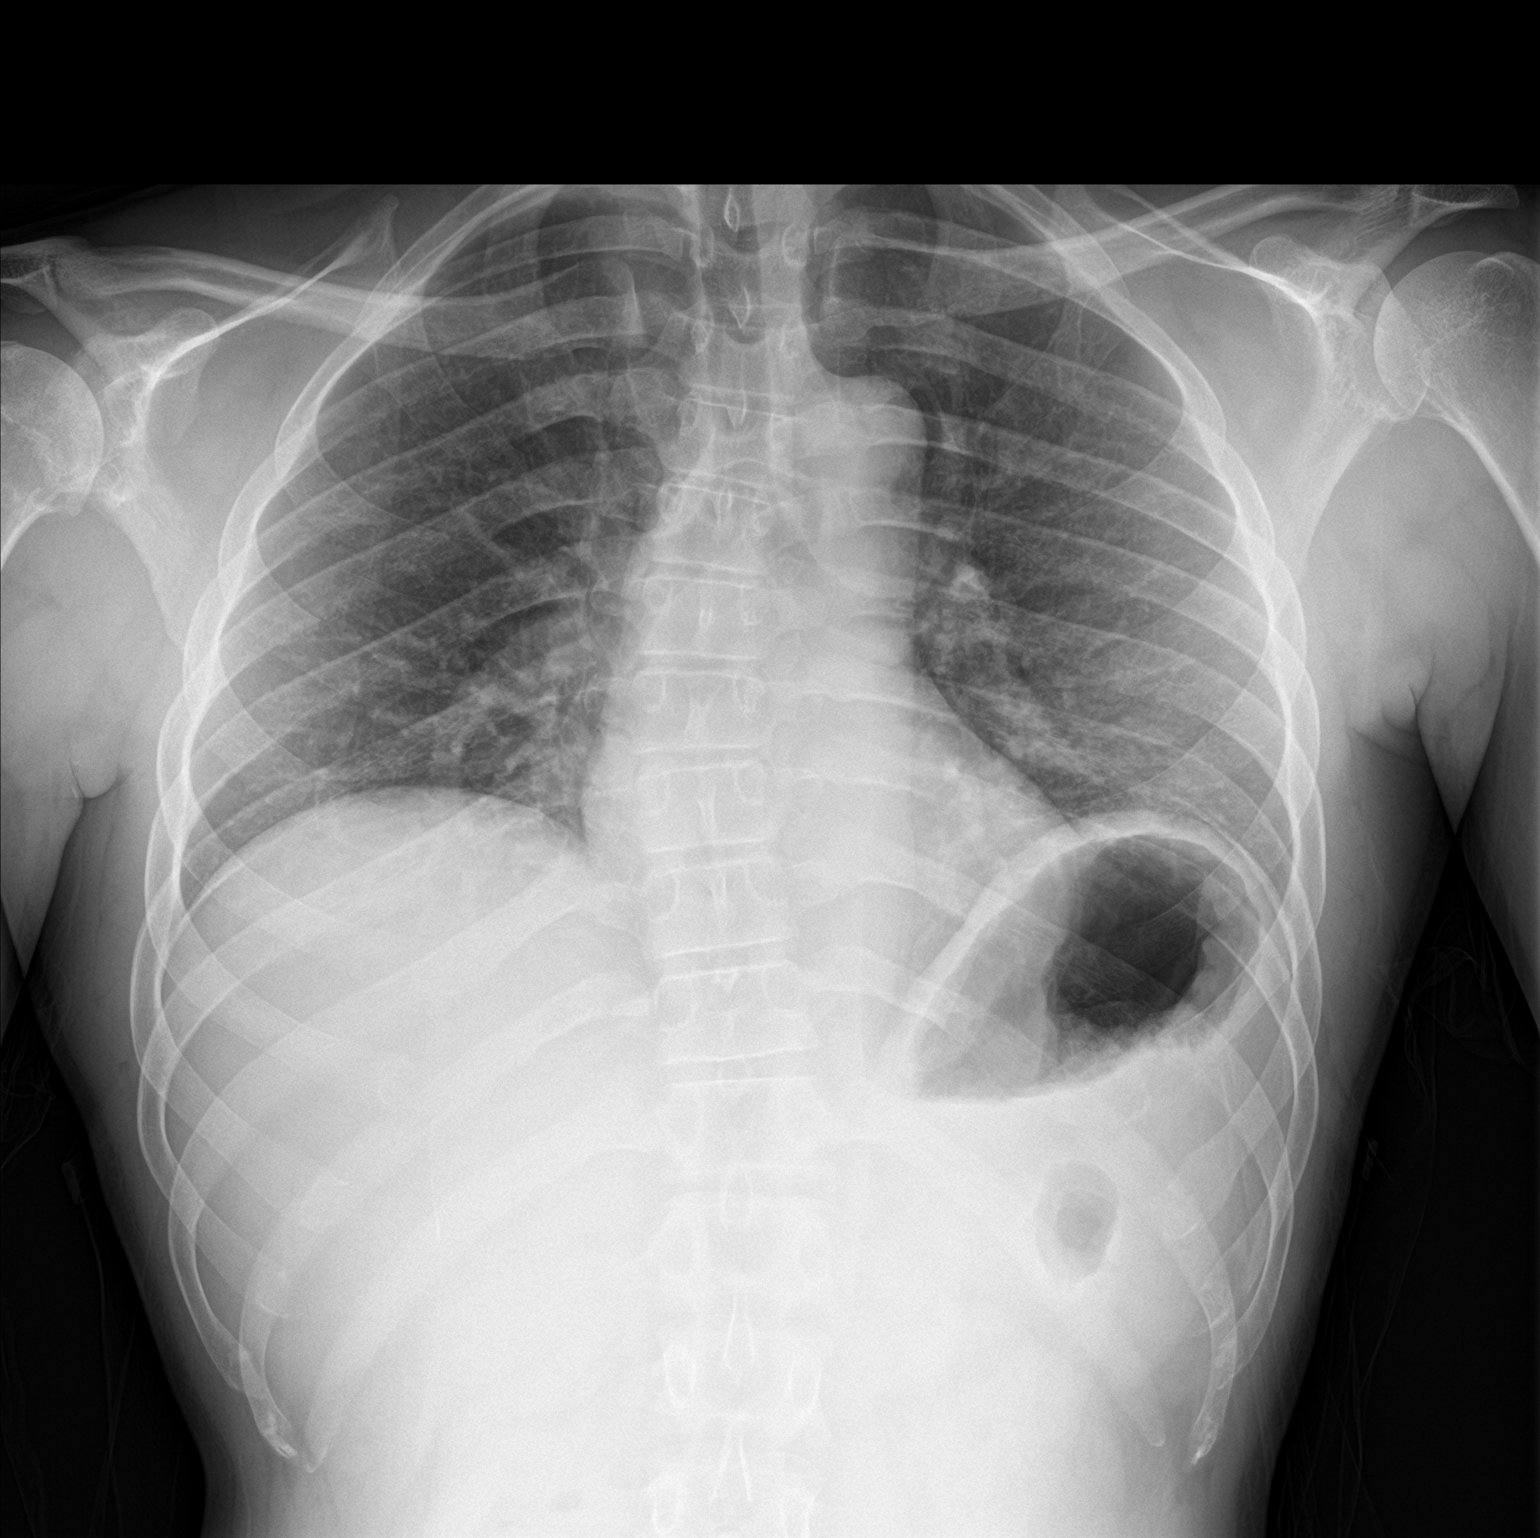

[chest lat]
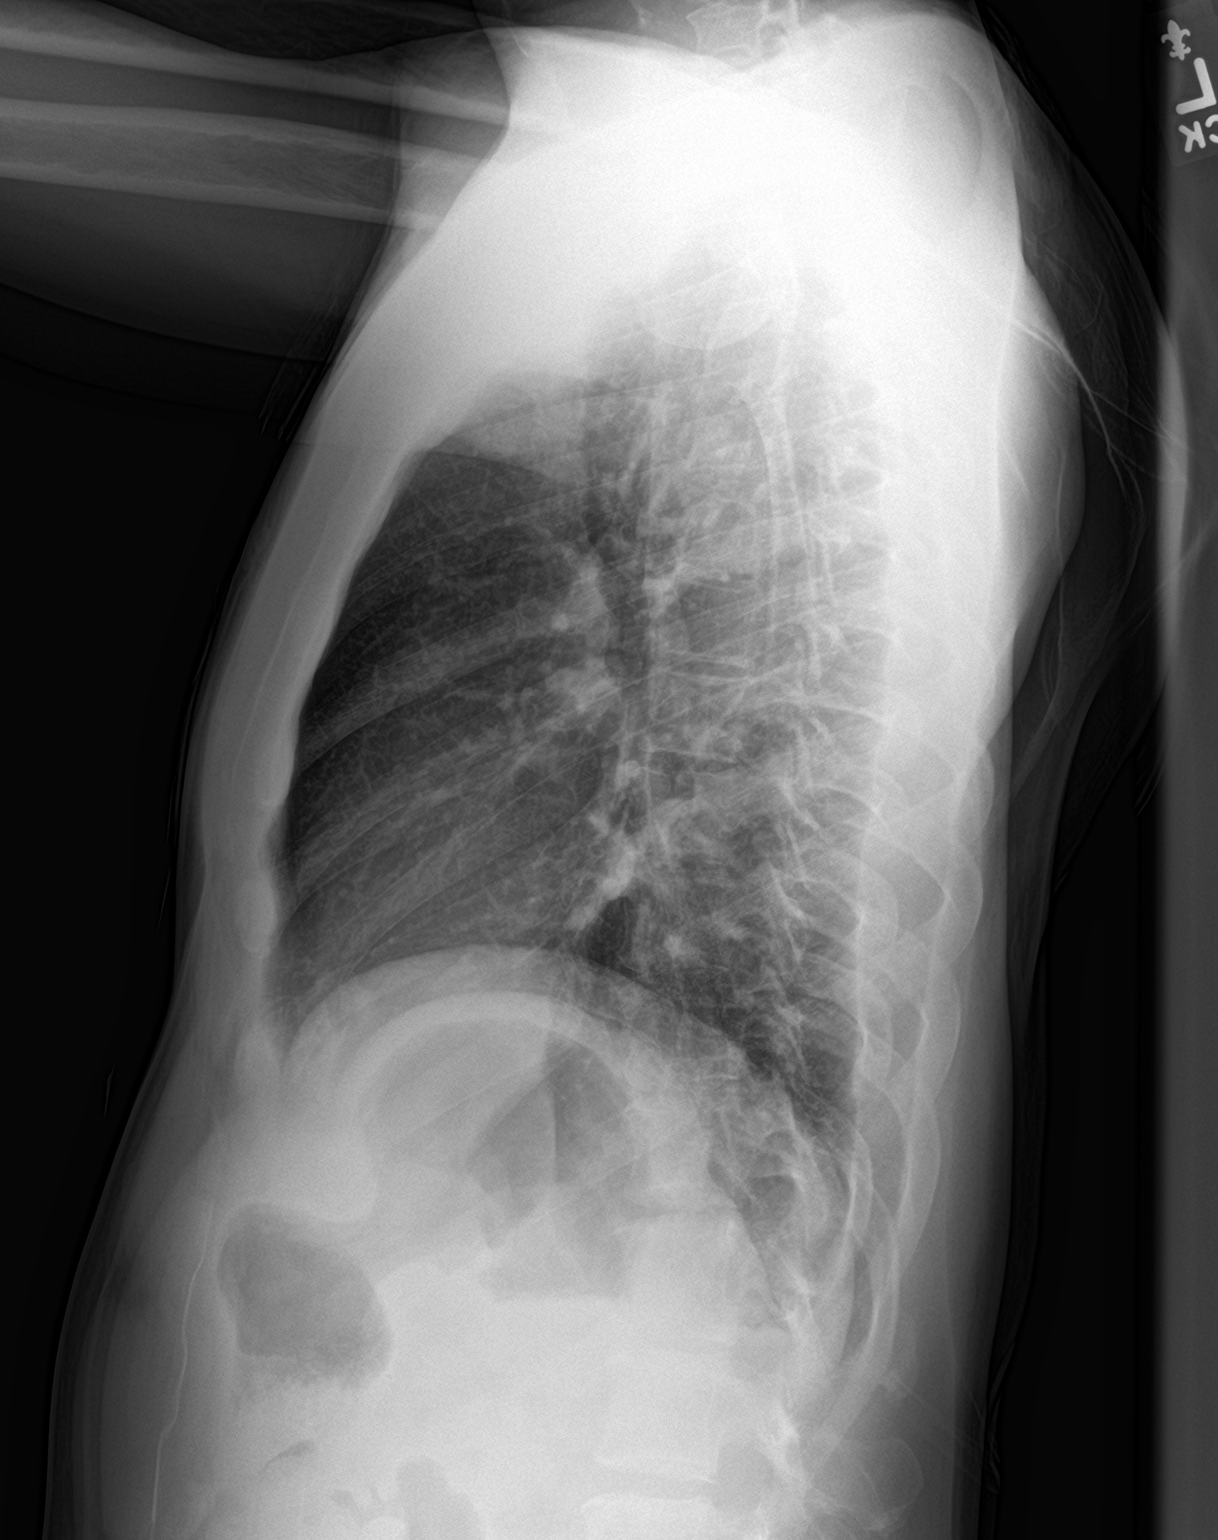

[2 of 2 positions shown; findings below may reference images not displayed]

FINDINGS: Low lung volumes. No confluent airspace opacities or effusions.
Heart is normal size. No acute bony abnormality.
IMPRESSION: Low lung volumes.  No active disease.

## 2023-04-04 ENCOUNTER — Other Ambulatory Visit: Payer: Self-pay

## 2023-04-04 MED ORDER — DOXYCYCLINE HYCLATE 20 MG PO TABS: 20.0000 mg | ORAL_TABLET | Freq: Two times a day (BID) | ORAL | 0 refills | Status: AC

## 2023-04-04 NOTE — Progress Notes (Signed)
90 day doxycycline request per pharmacy. aw

## 2023-04-12 ENCOUNTER — Other Ambulatory Visit (HOSPITAL_COMMUNITY): Payer: Self-pay

## 2023-04-13 ENCOUNTER — Ambulatory Visit: Payer: 59 | Admitting: Nurse Practitioner

## 2023-04-15 ENCOUNTER — Other Ambulatory Visit (HOSPITAL_COMMUNITY): Payer: Self-pay

## 2023-04-18 ENCOUNTER — Other Ambulatory Visit (HOSPITAL_COMMUNITY): Payer: Self-pay

## 2023-04-21 ENCOUNTER — Other Ambulatory Visit: Payer: Self-pay

## 2023-04-21 ENCOUNTER — Other Ambulatory Visit: Payer: Self-pay | Admitting: Infectious Diseases

## 2023-04-21 ENCOUNTER — Other Ambulatory Visit (HOSPITAL_COMMUNITY): Payer: Self-pay

## 2023-04-21 DIAGNOSIS — B20 Human immunodeficiency virus [HIV] disease: Secondary | ICD-10-CM

## 2023-04-21 MED ORDER — BIKTARVY 50-200-25 MG PO TABS
1.0000 | ORAL_TABLET | Freq: Every day | ORAL | 0 refills | Status: DC
Start: 2023-04-21 — End: 2024-01-20
  Filled 2023-04-21: qty 30, 30d supply, fill #0

## 2023-05-18 ENCOUNTER — Other Ambulatory Visit (HOSPITAL_COMMUNITY): Payer: Self-pay

## 2023-05-20 ENCOUNTER — Other Ambulatory Visit (HOSPITAL_COMMUNITY): Payer: Self-pay

## 2023-05-23 ENCOUNTER — Other Ambulatory Visit (HOSPITAL_COMMUNITY): Payer: Self-pay

## 2023-05-30 ENCOUNTER — Encounter: Payer: Self-pay | Admitting: Pharmacy Technician

## 2023-07-11 ENCOUNTER — Other Ambulatory Visit: Payer: Self-pay | Admitting: Nurse Practitioner

## 2023-07-11 DIAGNOSIS — J3481 Nasal mucositis (ulcerative): Secondary | ICD-10-CM

## 2023-07-14 ENCOUNTER — Other Ambulatory Visit (HOSPITAL_COMMUNITY): Payer: Self-pay

## 2023-07-14 ENCOUNTER — Other Ambulatory Visit: Payer: Self-pay | Admitting: Infectious Diseases

## 2023-07-14 DIAGNOSIS — B20 Human immunodeficiency virus [HIV] disease: Secondary | ICD-10-CM

## 2023-07-18 ENCOUNTER — Other Ambulatory Visit (HOSPITAL_COMMUNITY): Payer: Self-pay

## 2023-07-18 ENCOUNTER — Other Ambulatory Visit: Payer: Self-pay

## 2023-07-18 MED ORDER — BIKTARVY 50-200-25 MG PO TABS
1.0000 | ORAL_TABLET | Freq: Every day | ORAL | 0 refills | Status: DC
Start: 2023-07-18 — End: 2023-08-08
  Filled 2023-07-18 (×2): qty 30, 30d supply, fill #0

## 2023-07-20 ENCOUNTER — Ambulatory Visit: Payer: 59 | Admitting: Dermatology

## 2023-08-03 ENCOUNTER — Other Ambulatory Visit: Payer: Self-pay

## 2023-08-05 ENCOUNTER — Other Ambulatory Visit (HOSPITAL_COMMUNITY): Payer: Self-pay

## 2023-08-08 ENCOUNTER — Other Ambulatory Visit (HOSPITAL_COMMUNITY): Payer: Self-pay

## 2023-08-08 ENCOUNTER — Encounter (HOSPITAL_COMMUNITY): Payer: Self-pay

## 2023-08-08 ENCOUNTER — Other Ambulatory Visit: Payer: Self-pay | Admitting: Infectious Diseases

## 2023-08-08 ENCOUNTER — Other Ambulatory Visit: Payer: Self-pay

## 2023-08-08 DIAGNOSIS — B20 Human immunodeficiency virus [HIV] disease: Secondary | ICD-10-CM

## 2023-08-08 NOTE — Telephone Encounter (Signed)
Left VM patient needs appointment

## 2023-08-10 ENCOUNTER — Other Ambulatory Visit (HOSPITAL_COMMUNITY): Payer: Self-pay

## 2023-08-18 ENCOUNTER — Other Ambulatory Visit (HOSPITAL_COMMUNITY): Payer: Self-pay

## 2023-08-18 ENCOUNTER — Other Ambulatory Visit: Payer: Self-pay

## 2023-08-18 ENCOUNTER — Telehealth: Payer: Self-pay

## 2023-08-18 MED ORDER — BIKTARVY 50-200-25 MG PO TABS
1.0000 | ORAL_TABLET | Freq: Every day | ORAL | 0 refills | Status: DC
Start: 2023-08-18 — End: 2024-01-20
  Filled 2023-08-18: qty 30, 30d supply, fill #0

## 2023-08-18 NOTE — Telephone Encounter (Signed)
Attempted to call patient regarding appointment tomorrow. Will need to reschedule.  Left voicemail with patient requesting call back. Will send mychart message. Juanita Laster, RMA

## 2023-08-19 ENCOUNTER — Ambulatory Visit: Payer: 59 | Admitting: Pharmacist

## 2023-08-24 ENCOUNTER — Ambulatory Visit: Payer: 59 | Admitting: Infectious Diseases

## 2023-09-06 ENCOUNTER — Other Ambulatory Visit (HOSPITAL_COMMUNITY): Payer: Self-pay

## 2023-09-09 ENCOUNTER — Other Ambulatory Visit (HOSPITAL_COMMUNITY): Payer: Self-pay

## 2023-09-12 ENCOUNTER — Other Ambulatory Visit: Payer: Self-pay

## 2023-11-04 ENCOUNTER — Other Ambulatory Visit: Payer: Self-pay | Admitting: Infectious Diseases

## 2023-11-04 ENCOUNTER — Other Ambulatory Visit: Payer: Self-pay

## 2023-11-04 ENCOUNTER — Other Ambulatory Visit (HOSPITAL_COMMUNITY): Payer: Self-pay

## 2023-11-04 DIAGNOSIS — B20 Human immunodeficiency virus [HIV] disease: Secondary | ICD-10-CM

## 2023-11-04 NOTE — Progress Notes (Signed)
ERROR

## 2024-01-06 ENCOUNTER — Other Ambulatory Visit: Payer: Self-pay

## 2024-01-06 ENCOUNTER — Telehealth: Payer: Self-pay | Admitting: Pharmacist

## 2024-01-06 NOTE — Progress Notes (Signed)
 Disenrolling from Specialty Pharmacy due to compliance with Biktarvy . [facility] clinic and provider aware.

## 2024-01-06 NOTE — Telephone Encounter (Signed)
 FYI - our call center pharmacy staff (along with clinic staff here) has not been able to reach [facility] for refills (or appointments). We are disenrolling him from our pharmacy services. He is overdue for refill and appointment. Last seen December 2023 and last filled Biktarvy  in September.   Alan Geralds, PharmD, CPP, BCIDP, AAHIVP Clinical Pharmacist Practitioner Infectious Diseases Clinical Pharmacist [facility] for Infectious Disease

## 2024-01-20 ENCOUNTER — Other Ambulatory Visit (HOSPITAL_COMMUNITY): Payer: Self-pay

## 2024-01-20 ENCOUNTER — Ambulatory Visit (INDEPENDENT_AMBULATORY_CARE_PROVIDER_SITE_OTHER): Payer: 59 | Admitting: Internal Medicine

## 2024-01-20 ENCOUNTER — Encounter: Payer: Self-pay | Admitting: Internal Medicine

## 2024-01-20 ENCOUNTER — Other Ambulatory Visit: Payer: Self-pay

## 2024-01-20 ENCOUNTER — Other Ambulatory Visit: Payer: Self-pay | Admitting: Pharmacist

## 2024-01-20 VITALS — BP 169/117 | HR 72 | Temp 98.0°F | Ht 68.0 in | Wt 141.0 lb

## 2024-01-20 DIAGNOSIS — B20 Human immunodeficiency virus [HIV] disease: Secondary | ICD-10-CM | POA: Diagnosis not present

## 2024-01-20 DIAGNOSIS — Z113 Encounter for screening for infections with a predominantly sexual mode of transmission: Secondary | ICD-10-CM

## 2024-01-20 MED ORDER — BICTEGRAVIR-EMTRICITAB-TENOFOV 50-200-25 MG PO TABS
1.0000 | ORAL_TABLET | Freq: Every day | ORAL | 5 refills | Status: DC
  Filled 2024-01-20: qty 30, 30d supply, fill #0
  Filled 2024-02-09: qty 30, 30d supply, fill #1
  Filled 2024-03-16: qty 30, 30d supply, fill #2
  Filled 2024-04-18: qty 30, 30d supply, fill #3
  Filled 2024-05-14: qty 30, 30d supply, fill #4
  Filled 2024-06-07 – 2024-06-25 (×2): qty 30, 30d supply, fill #5

## 2024-01-20 NOTE — Progress Notes (Signed)
[address], [place], [place], [address]                                                                  Phn. [phone]; Fax: [phone]                                                                             Date: 11/08/22  Reason for Visit: HIV follow up   HPI: Charles Dalton is a 48 y.o.old male with a history of HIV who is here for follow-up. Reports being compliant with Biktarvy, no missed doses or barriers to adherence. Continues to have ongoing diarrhea, describes as watery to soft, every time when he eats food. Denies blood in stool. Denies nausea and vomiting, abdominal discomfort sometimes. Continues to smoke marijuana. He has not seen GI yet neither has submitted stool samples for GI PCR as well as stool ova and parasites. He does not tell me clearly why he  has not seen GI yet but is agreeable to see them once his new insurance kicks in from January. Denies any appetite or weight loss. He has an appt with dermatology in May for chronic appearing scattered lesions in his trunk. Denies any new skin lesions. Denies being sexually active. Denies GU symptoms. He follows with [facility].   01/20/24 id clinic f/u Patient sees dr Elinor Parkinson; last seen 2 years ago On biktarvy but out for 2 months Green discharge nasal but no tooth pain/fever/chill -- has seasonal allergy. Course stable   ROS:  All other ros negative   Current Outpatient Medications on File Prior to Visit  Medication Sig Dispense Refill   atenolol (TENORMIN) 25 MG tablet Take 0.5 tablets (12.5 mg total) by mouth at bedtime. 30 tablet 3   bictegravir-emtricitabine-tenofovir AF (BIKTARVY) 50-200-25 MG TABS tablet Take 1 tablet by mouth daily. No further refills until seen in clinic for appointment. 30 tablet 0    bictegravir-emtricitabine-tenofovir AF (BIKTARVY) 50-200-25 MG TABS tablet Take 1 tablet by mouth daily. No further refills until seen in clinic for appointment. 30 tablet 0   doxycycline (VIBRA-TABS) 100 MG tablet Take 1 tablet (100 mg total) by mouth 2 (two) times daily. 20 tablet 0   fluticasone (FLONASE) 50 MCG/ACT nasal spray Place 2 sprays into both nostrils daily. 16 g 6   nicotine (NICODERM CQ - DOSED IN MG/24 HOURS) 21 mg/24hr patch Place 1 patch (21 mg total) onto the skin daily. 30 patch 2   Pitavastatin Calcium 4 MG TABS Take 1 tablet (4 mg total) by mouth daily. 30 tablet 11   Sulfacetamide Sodium-Sulfur (SULFACETAMIDE SOD-SULFUR WASH) 9-4 %  LIQD Apply to affected skin once a day 120 mL 6   tretinoin (RETIN-A) 0.025 % cream Apply topically at bedtime. Spot treat at bedtime 45 g 6   No current facility-administered medications on file prior to visit.    No Known Allergies   Social History   Socioeconomic History   Marital status: Single    Spouse name: Not on file   Number of children: Not on file   Years of education: Not on file   Highest education level: Not on file  Occupational History   Not on file  Tobacco Use   Smoking status: Every Day   Smokeless tobacco: Never  Substance and Sexual Activity   Alcohol use: Yes   Drug use: Not Currently   Sexual activity: Not Currently    Comment: declined condoms  Other Topics Concern   Not on file  Social History Narrative   Not on file   Social Drivers of Health   Financial Resource Strain: Not on file  Food Insecurity: Not on file  Transportation Needs: Not on file  Physical Activity: Not on file  Stress: Not on file  Social Connections: Not on file  Intimate Partner Violence: Not on file   Family History  Problem Relation Age of Onset   Prostate cancer Father    Prostate cancer Maternal Grandfather     Vitals  BP (!) 172/120   Pulse 72   Temp 98 F (36.7 C) (Temporal)   Ht 5\' 8"  (1.727 m)   Wt 141 lb  (64 kg)   SpO2 100%   BMI 21.44 kg/m    Examination  Gen: Alert and oriented x 3, no acute distress HEENT: no scleral icterus, no pale conjunctivae, hearing normal, oral mucosa moist Neck: Supple Cardio: Regular rate Resp: CTAB GI: Soft, nondistended, non tender    Lab Results HIV 1 RNA Quant (copies/mL)  Date Value  11/08/2022 NOT DETECTED  03/16/2022 <20 DETECTED (A)  09/23/2021 NOT DETECTED   No results found for: "HIV1GENOSEQ" Lab Results  Component Value Date   WBC 3.7 03/16/2022   HGB 13.4 03/16/2022   HCT 40.0 03/16/2022   MCV 108 (H) 03/16/2022   PLT 209 03/16/2022    Lab Results  Component Value Date   CREATININE 0.96 11/08/2022   BUN 12 11/08/2022   NA 143 11/08/2022   K 3.5 11/08/2022   CL 101 11/08/2022   CO2 30 11/08/2022   Lab Results  Component Value Date   ALT 69 (H) 11/08/2022   AST 126 (H) 11/08/2022   ALKPHOS 103 12/22/2021   BILITOT 0.7 11/08/2022    Lab Results  Component Value Date   CHOL 160 12/22/2021   TRIG 104 12/22/2021   HDL 82 12/22/2021   LDLCALC 60 12/22/2021   Lab Results  Component Value Date   HAV NON-REACTIVE 12/19/2020   Lab Results  Component Value Date   HEPBSAG NON-REACTIVE 12/19/2020   HEPBSAB NON-REACTIVE 12/19/2020   No results found for: "HCVAB" Lab Results  Component Value Date   CHLAMYDIAWP Negative 11/08/2022   N Negative 11/08/2022   No results found for: "GCPROBEAPT" No results found for: "QUANTGOLD"   Health Maintenance: Immunization History  Administered Date(s) Administered   Influenza,inj,Quad PF,6+ Mos 01/02/2021, 09/23/2021   Moderna Sars-Covid-2 Vaccination 02/11/2020, 03/10/2020   PFIZER Comirnaty(Gray Top)Covid-19 Tri-Sucrose Vaccine 01/02/2021   Pfizer Covid-19 Vaccine Bivalent Booster 81yrs & up 03/16/2022   Pneumococcal Conjugate-13 11/13/2013    Labs: Lab Results  Component Value  Date   WBC 3.7 03/16/2022   HGB 13.4 03/16/2022   HCT 40.0 03/16/2022   MCV 108 (H)  03/16/2022   PLT 209 03/16/2022   Last metabolic panel Lab Results  Component Value Date   GLUCOSE 96 11/08/2022   NA 143 11/08/2022   K 3.5 11/08/2022   CL 101 11/08/2022   CO2 30 11/08/2022   BUN 12 11/08/2022   CREATININE 0.96 11/08/2022   EGFR 75 12/22/2021   CALCIUM 8.8 11/08/2022   PROT 7.3 11/08/2022   ALBUMIN 4.6 12/22/2021   LABGLOB 3.2 12/22/2021   AGRATIO 1.4 12/22/2021   BILITOT 0.7 11/08/2022   ALKPHOS 103 12/22/2021   AST 126 (H) 11/08/2022   ALT 69 (H) 11/08/2022   ANIONGAP 10 12/08/2021   HIV: Lab Results  Component Value Date   HIV1RNAQUANT NOT DETECTED 11/08/2022  CD4 1000 (55%) on 02/2022  Assessment/Plan: # HIV  Out of meds for 2 months [employer] care insurance  - Continue [place], Meds refilled - Labs today  - Fu in 6 months    #elevated bp Assymptomatic  F/u pcp   #rash He said he had seen derm and biopsied doesn't remember what it was and given a cream also doesn't remember name. Cream works for outbreak; he is out  F/u dermatology  # STD Screening Urine GC and RPR No acute concerns     #allergic rhinitis Advise netty pot and prn antihistamine No sign of acute bacterial sinusitis      Raymondo Band, MD [facility] [phone]  pager   [phone] cell 01/20/2024, 10:49 AM

## 2024-01-20 NOTE — Patient Instructions (Addendum)
Continue biktarvy as you are  Std and routine hiv labs today   See dr Elinor Parkinson in routine follow up in 6 months    Please see your primary care for monitoring blood pressure   I couldn't find any steroid cream on your list -- please check with dermatology to see if they can prescribe more

## 2024-01-20 NOTE — Progress Notes (Signed)
Specialty Pharmacy Initiation Note   Charles Dalton is a 48 y.o. male who will be followed by the specialty pharmacy service for RxSp HIV    Review of administration, indication, effectiveness, safety, potential side effects, storage/disposable, and missed dose instructions occurred today for patient's specialty medication(s) Bictegravir-Emtricitab-Tenofov [facility])     Patient/Caregiver did not have any additional questions or concerns.   Patient's therapy is appropriate to: Initiate    Goals Addressed             This Visit's Progress    Achieve Undetectable HIV Viral Load < 20       Patient is not on track and improving. Patient will work on increased adherence      Comply with lab assessments       Patient is not on track and improving. Patient will be evaluated at upcoming provider appointment to assess progress      Maintain optimal adherence to therapy       Patient is not on track and improving. Patient will work on increased adherence         Jennette Kettle Specialty Pharmacist

## 2024-01-20 NOTE — Progress Notes (Signed)
Specialty Pharmacy Initial Fill Coordination Note  Francisco Ostrovsky is a 48 y.o. male contacted today regarding initial fill of specialty medication(s) Bictegravir-Emtricitab-Tenofov [facility])   Patient requested Delivery   Delivery date: 01/24/24   Verified address: [address] [place] [address]   Medication will be filled on 01/23/24.   Patient is aware of $0 copayment.

## 2024-01-21 LAB — C. TRACHOMATIS/N. GONORRHOEAE RNA
C. trachomatis RNA, TMA: NOT DETECTED
N. gonorrhoeae RNA, TMA: NOT DETECTED

## 2024-01-21 LAB — TRICHOMONAS VAGINALIS, PROBE AMP: Trichomonas vaginalis RNA: NOT DETECTED

## 2024-01-24 ENCOUNTER — Encounter: Payer: Self-pay | Admitting: Internal Medicine

## 2024-01-24 LAB — COMPLETE METABOLIC PANEL WITH GFR
AG Ratio: 2.1 (calc) (ref 1.0–2.5)
ALT: 47 U/L — ABNORMAL HIGH (ref 9–46)
AST: 70 U/L — ABNORMAL HIGH (ref 10–40)
Albumin: 5 g/dL (ref 3.6–5.1)
Alkaline phosphatase (APISO): 91 U/L (ref 36–130)
BUN: 14 mg/dL (ref 7–25)
CO2: 26 mmol/L (ref 20–32)
Calcium: 9.2 mg/dL (ref 8.6–10.3)
Chloride: 104 mmol/L (ref 98–110)
Creat: 1.01 mg/dL (ref 0.60–1.29)
Globulin: 2.4 g/dL (ref 1.9–3.7)
Glucose, Bld: 88 mg/dL (ref 65–99)
Potassium: 3.8 mmol/L (ref 3.5–5.3)
Sodium: 141 mmol/L (ref 135–146)
Total Bilirubin: 0.3 mg/dL (ref 0.2–1.2)
Total Protein: 7.4 g/dL (ref 6.1–8.1)
eGFR: 92 mL/min/{1.73_m2} (ref >=60)

## 2024-01-24 LAB — CBC WITH DIFFERENTIAL/PLATELET
Absolute Lymphocytes: 2873 {cells}/uL (ref 850–3900)
Absolute Monocytes: 524 {cells}/uL (ref 200–950)
Basophils Absolute: 49 {cells}/uL (ref 0–200)
Basophils Relative: 0.9 %
Eosinophils Absolute: 32 {cells}/uL (ref 15–500)
Eosinophils Relative: 0.6 %
HCT: 38.6 % (ref 38.5–50.0)
Hemoglobin: 13 g/dL — ABNORMAL LOW (ref 13.2–17.1)
MCH: 36.3 pg — ABNORMAL HIGH (ref 27.0–33.0)
MCHC: 33.7 g/dL (ref 32.0–36.0)
MCV: 107.8 fL — ABNORMAL HIGH (ref 80.0–100.0)
MPV: 10.9 fL (ref 7.5–12.5)
Monocytes Relative: 9.7 %
Neutro Abs: 1922 {cells}/uL (ref 1500–7800)
Neutrophils Relative %: 35.6 %
Platelets: 212 10*3/uL (ref 140–400)
RBC: 3.58 10*6/uL — ABNORMAL LOW (ref 4.20–5.80)
RDW: 12.6 % (ref 11.0–15.0)
Total Lymphocyte: 53.2 %
WBC: 5.4 10*3/uL (ref 3.8–10.8)

## 2024-01-24 LAB — T-HELPER CELLS (CD4) COUNT (NOT AT ARMC)
Absolute CD4: 1413 {cells}/uL (ref 490–1740)
CD4 T Helper %: 53 % (ref 30–61)
Total lymphocyte count: 2673 {cells}/uL (ref 850–3900)

## 2024-01-24 LAB — SYPHILIS: RPR W/REFLEX TO RPR TITER AND TREPONEMAL ANTIBODIES, TRADITIONAL SCREENING AND DIAGNOSIS ALGORITHM: RPR Ser Ql: NONREACTIVE

## 2024-01-24 LAB — HIV-1 RNA QUANT-NO REFLEX-BLD
HIV 1 RNA Quant: 59 {copies}/mL — ABNORMAL HIGH
HIV-1 RNA Quant, Log: 1.77 {Log} — ABNORMAL HIGH

## 2024-02-06 ENCOUNTER — Other Ambulatory Visit: Payer: Self-pay

## 2024-02-09 ENCOUNTER — Other Ambulatory Visit: Payer: Self-pay

## 2024-02-09 NOTE — Progress Notes (Signed)
 Specialty Pharmacy Refill Coordination Note  Brace Welte is a 48 y.o. male contacted today regarding refills of specialty medication(s) Bictegravir-Emtricitab-Tenofov [facility])   Patient requested Delivery   Delivery date: 02/20/24   Verified address: [address] [place] [address]   Medication will be filled on 02/17/24.

## 2024-03-13 ENCOUNTER — Other Ambulatory Visit: Payer: Self-pay

## 2024-03-16 ENCOUNTER — Other Ambulatory Visit: Payer: Self-pay

## 2024-03-16 NOTE — Progress Notes (Signed)
 Specialty Pharmacy Refill Coordination Note  Charles Dalton is a 48 y.o. male contacted today regarding refills of specialty medication(s) Bictegravir-Emtricitab-Tenofov (BIKTARVY )   Patient requested Delivery   Delivery date: 03/21/24   Verified address: [address]  [place] [address]   Medication will be filled on 04.22.25.

## 2024-03-20 ENCOUNTER — Other Ambulatory Visit: Payer: Self-pay

## 2024-04-09 NOTE — Progress Notes (Signed)
 The ASCVD Risk score (Arnett DK, et al., 2019) failed to calculate for the following reasons:   Unable to determine if patient is Non-Hispanic African American  Charles Dalton, Scientist, research (physical sciences), Charity fundraiser

## 2024-04-12 ENCOUNTER — Other Ambulatory Visit (HOSPITAL_COMMUNITY): Payer: Self-pay

## 2024-04-18 ENCOUNTER — Other Ambulatory Visit: Payer: Self-pay

## 2024-04-18 NOTE — Progress Notes (Signed)
 Specialty Pharmacy Refill Coordination Note  Deuntae Kocsis is a 48 y.o. male contacted today regarding refills of specialty medication(s) Bictegravir-Emtricitab-Tenofov (BIKTARVY )   Patient requested Delivery   Delivery date: 04/20/24   Verified address: [address] [place] [address]   Medication will be filled on 04/19/24.

## 2024-04-18 NOTE — Progress Notes (Signed)
 Specialty Pharmacy Ongoing Clinical Assessment Note  Charles Dalton is a 48 y.o. male who is being followed by the specialty pharmacy service for RxSp HIV   Patient's specialty medication(s) reviewed today: Bictegravir-Emtricitab-Tenofov (BIKTARVY )   Missed doses in the last 4 weeks: 2   Patient/Caregiver did not have any additional questions or concerns.   Therapeutic benefit summary: Patient is achieving benefit   Adverse events/side effects summary: Experienced adverse events/side effects (persistent diarrhea)   Patient's therapy is appropriate to: Continue    Goals Addressed             This Visit's Progress    Achieve Undetectable HIV Viral Load < 20   Improving    Patient is not on track and improving. Patient will work on increased adherence      Comply with lab assessments   Improving    Patient is not on track and improving. Patient will be evaluated at upcoming provider appointment to assess progress      Maintain optimal adherence to therapy   Improving    Patient is not on track and improving. Patient will work on increased adherence         Follow up: 3 months  Cinzia Devos M [employer] Specialty Pharmacist

## 2024-05-11 ENCOUNTER — Other Ambulatory Visit: Payer: Self-pay

## 2024-05-14 ENCOUNTER — Other Ambulatory Visit: Payer: Self-pay

## 2024-05-16 ENCOUNTER — Other Ambulatory Visit: Payer: Self-pay

## 2024-05-16 NOTE — Progress Notes (Signed)
 Specialty Pharmacy Refill Coordination Note  Charles Dalton is a 48 y.o. male contacted today regarding refills of specialty medication(s) Bictegravir-Emtricitab-Tenofov (BIKTARVY )   Patient requested Delivery   Delivery date: 05/17/24   Verified address: [address] [place] [address]   Medication will be filled on 05/16/24.

## 2024-06-05 ENCOUNTER — Other Ambulatory Visit (HOSPITAL_COMMUNITY): Payer: Self-pay

## 2024-06-05 ENCOUNTER — Encounter (HOSPITAL_COMMUNITY): Payer: Self-pay

## 2024-06-07 ENCOUNTER — Other Ambulatory Visit (HOSPITAL_COMMUNITY): Payer: Self-pay

## 2024-06-11 ENCOUNTER — Other Ambulatory Visit: Payer: Self-pay

## 2024-06-19 ENCOUNTER — Encounter: Payer: Self-pay | Admitting: Dermatology

## 2024-06-19 ENCOUNTER — Ambulatory Visit (INDEPENDENT_AMBULATORY_CARE_PROVIDER_SITE_OTHER): Admitting: Dermatology

## 2024-06-19 DIAGNOSIS — Z7189 Other specified counseling: Secondary | ICD-10-CM

## 2024-06-19 DIAGNOSIS — Z79899 Other long term (current) drug therapy: Secondary | ICD-10-CM

## 2024-06-19 DIAGNOSIS — L739 Follicular disorder, unspecified: Secondary | ICD-10-CM

## 2024-06-19 DIAGNOSIS — L709 Acne, unspecified: Secondary | ICD-10-CM | POA: Diagnosis not present

## 2024-06-19 DIAGNOSIS — L732 Hidradenitis suppurativa: Secondary | ICD-10-CM | POA: Diagnosis not present

## 2024-06-19 DIAGNOSIS — L988 Other specified disorders of the skin and subcutaneous tissue: Secondary | ICD-10-CM | POA: Diagnosis not present

## 2024-06-19 DIAGNOSIS — L700 Acne vulgaris: Secondary | ICD-10-CM

## 2024-06-19 NOTE — Patient Instructions (Signed)

## 2024-06-19 NOTE — Progress Notes (Unsigned)
   Follow-Up Visit   Subjective  Charles Dalton is a 48 y.o. male who presents for the following: Folliculitis chest not taking Doxycycline  20mg  pt ran out and did not get refilled Tretinoin  0.025% cr qhs, SM Sulfa wash qd, no improvement, Acne face no improvement, Tretinoin  0.025% cr qhs, facial elastosis, pt wanted to discuss Botox and filler  The following portions of the chart were reviewed this encounter and updated as appropriate: medications, allergies, medical history  Review of Systems:  No other skin or systemic complaints except as noted in HPI or Assessment and Plan.  Objective  Well appearing patient in no apparent distress; mood and affect are within normal limits.  A focused examination was performed of the following areas: Face, chest  Relevant exam findings are noted in the Assessment and Plan.   Assessment & Plan   FOLLICULITIS and Acne Hidradentitis trunk Labs from 01/20/24 viewed Exam: paps and hyperpigmented macules and crust trunk  Folliculitis occurs due to inflammation of the superficial hair follicle (pore), resulting in acne-like lesions (pus bumps). It can be infectious (bacterial, fungal) or noninfectious (shaving, tight clothing, heat/sweat, medications).  Folliculitis can be acute or chronic and recommended treatment depends on the underlying cause of folliculitis.  Treatment Plan: Discussed Isotretinoin Isotretinoin Counseling; Review and Contraception Counseling: Reviewed potential side effects of isotretinoin including xerosis, cheilitis, hepatitis, hyperlipidemia, and severe birth defects if taken by a pregnant woman.  Women on isotretinoin must be celibate (not having sex) or required to use at least 2 birth control methods to prevent pregnancy (unless patient is a male of non-child bearing potential).  Females of child-bearing potential must have monthly pregnancy tests while on isotretinoin and report through I-Pledge (FDA monitoring  program). Reviewed reports of suicidal ideation in those with a history of depression while taking isotretinoin and reports of diagnosis of inflammatory bowl disease (IBD) while taking isotretinoin as well as the lack of evidence for a causal relationship between isotretinoin, depression and IBD. Patient advised to reach out with any questions or concerns. Patient advised not to share pills or donate blood while on treatment or for one month after completing treatment. All patient's considering Isotretinoin must read and understand and sign Isotretinoin Consent Form and be registered with I-Pledge.   IPLEDGE Consents signed today Patient registered in [facility] program [facility] [phone] [facility] [address] [place] Patient advised to d/c Tretinoin  and Sulfa wash  Pending labs will start Isotretinoin 20mg  1 po qd with fatty meal  FACIAL ELASTOSIS Exam: Rhytides and volume loss. Treatment Plan: Discussed Botox, do not recommend while on Isotretinoin Recommend daily broad spectrum sunscreen SPF 30+ to sun-exposed areas, reapply every 2 hours as needed. Call for new or changing lesions.  Staying in the shade or wearing long sleeves, sun glasses (UVA+UVB protection) and wide brim hats (4-inch brim around the entire circumference of the hat) are also recommended for sun protection.   FOLLICULITIS   Related Procedures Lipid panel Hepatic Function Panel  Return for 4-5 wks Isotretinoin f/u.  I, Grayce Saunas, RMA, am acting as scribe for Alm Rhyme, MD .   Documentation: I have reviewed the above documentation for accuracy and completeness, and I agree with the above.  Alm Rhyme, MD

## 2024-06-20 ENCOUNTER — Encounter: Payer: Self-pay | Admitting: Dermatology

## 2024-06-25 ENCOUNTER — Other Ambulatory Visit: Payer: Self-pay

## 2024-06-25 ENCOUNTER — Other Ambulatory Visit

## 2024-06-25 ENCOUNTER — Other Ambulatory Visit (HOSPITAL_COMMUNITY): Payer: Self-pay

## 2024-06-25 DIAGNOSIS — Z113 Encounter for screening for infections with a predominantly sexual mode of transmission: Secondary | ICD-10-CM

## 2024-06-25 DIAGNOSIS — B20 Human immunodeficiency virus [HIV] disease: Secondary | ICD-10-CM

## 2024-06-25 DIAGNOSIS — Z79899 Other long term (current) drug therapy: Secondary | ICD-10-CM

## 2024-06-25 NOTE — Progress Notes (Signed)
 Specialty Pharmacy Refill Coordination Note  Charles Dalton is a 48 y.o. male contacted today regarding refills of specialty medication(s) Bictegravir-Emtricitab-Tenofov (BIKTARVY )   Patient requested Delivery   Delivery date: 06/27/24   Verified address: [address] [place]  [address]   Medication will be filled on 06/26/24.

## 2024-06-25 NOTE — Progress Notes (Signed)
 Hepatic function panel ordered per Dermatology.   Peytin Dechert, BSN, RN

## 2024-06-26 ENCOUNTER — Other Ambulatory Visit: Payer: Self-pay

## 2024-06-26 LAB — T-HELPER CELL (CD4) - (RCID CLINIC ONLY)
CD4 % Helper T Cell: 57 % (ref 33–65)
CD4 T Cell Abs: 1366 /uL (ref 400–1790)

## 2024-06-27 LAB — CBC WITH DIFFERENTIAL/PLATELET
Absolute Lymphocytes: 2574 {cells}/uL (ref 850–3900)
Absolute Monocytes: 416 {cells}/uL (ref 200–950)
Basophils Absolute: 33 {cells}/uL (ref 0–200)
Basophils Relative: 0.5 %
Eosinophils Absolute: 13 {cells}/uL — ABNORMAL LOW (ref 15–500)
Eosinophils Relative: 0.2 %
HCT: 38.4 % — ABNORMAL LOW (ref 38.5–50.0)
Hemoglobin: 13 g/dL — ABNORMAL LOW (ref 13.2–17.1)
MCH: 36.3 pg — ABNORMAL HIGH (ref 27.0–33.0)
MCHC: 33.9 g/dL (ref 32.0–36.0)
MCV: 107.3 fL — ABNORMAL HIGH (ref 80.0–100.0)
MPV: 11.5 fL (ref 7.5–12.5)
Monocytes Relative: 6.3 %
Neutro Abs: 3564 {cells}/uL (ref 1500–7800)
Neutrophils Relative %: 54 %
Platelets: 206 Thousand/uL (ref 140–400)
RBC: 3.58 Million/uL — ABNORMAL LOW (ref 4.20–5.80)
RDW: 12.2 % (ref 11.0–15.0)
Total Lymphocyte: 39 %
WBC: 6.6 Thousand/uL (ref 3.8–10.8)

## 2024-06-27 LAB — LIPID PANEL
Cholesterol: 187 mg/dL (ref <200)
HDL: 103 mg/dL (ref >=40)
LDL Cholesterol (Calc): 44 mg/dL
Non-HDL Cholesterol (Calc): 84 mg/dL (ref <130)
Total CHOL/HDL Ratio: 1.8 (calc) (ref <5.0)
Triglycerides: 379 mg/dL — ABNORMAL HIGH (ref <150)

## 2024-06-27 LAB — COMPLETE METABOLIC PANEL WITHOUT GFR
AG Ratio: 1.9 (calc) (ref 1.0–2.5)
ALT: 54 U/L — ABNORMAL HIGH (ref 9–46)
AST: 57 U/L — ABNORMAL HIGH (ref 10–40)
Albumin: 4.8 g/dL (ref 3.6–5.1)
Alkaline phosphatase (APISO): 83 U/L (ref 36–130)
BUN: 13 mg/dL (ref 7–25)
CO2: 22 mmol/L (ref 20–32)
Calcium: 9.2 mg/dL (ref 8.6–10.3)
Chloride: 104 mmol/L (ref 98–110)
Creat: 0.93 mg/dL (ref 0.60–1.29)
Globulin: 2.5 g/dL (ref 1.9–3.7)
Glucose, Bld: 203 mg/dL — ABNORMAL HIGH (ref 65–99)
Potassium: 3.6 mmol/L (ref 3.5–5.3)
Sodium: 139 mmol/L (ref 135–146)
Total Bilirubin: 0.8 mg/dL (ref 0.2–1.2)
Total Protein: 7.3 g/dL (ref 6.1–8.1)

## 2024-06-27 LAB — HEPATIC FUNCTION PANEL
AG Ratio: 1.9 (calc) (ref 1.0–2.5)
ALT: 54 U/L — ABNORMAL HIGH (ref 9–46)
AST: 57 U/L — ABNORMAL HIGH (ref 10–40)
Albumin: 4.8 g/dL (ref 3.6–5.1)
Alkaline phosphatase (APISO): 83 U/L (ref 36–130)
Bilirubin, Direct: 0.2 mg/dL (ref 0.0–0.2)
Globulin: 2.5 g/dL (ref 1.9–3.7)
Indirect Bilirubin: 0.6 mg/dL (ref 0.2–1.2)
Total Bilirubin: 0.8 mg/dL (ref 0.2–1.2)
Total Protein: 7.3 g/dL (ref 6.1–8.1)

## 2024-06-27 LAB — HIV-1 RNA QUANT-NO REFLEX-BLD
HIV 1 RNA Quant: <20 {copies}/mL — AB
HIV-1 RNA Quant, Log: <1.3 {Log_copies}/mL — AB

## 2024-06-27 LAB — RPR: RPR Ser Ql: NONREACTIVE

## 2024-06-28 ENCOUNTER — Telehealth: Payer: Self-pay

## 2024-06-28 MED ORDER — ISOTRETINOIN 20 MG PO CAPS: 20.0000 mg | ORAL_CAPSULE | Freq: Every day | ORAL | 0 refills | Status: AC

## 2024-06-28 NOTE — Telephone Encounter (Signed)
 Patient has been advised of lab results. RX sent in and patient qualified to receive drug in ipledge. aw

## 2024-06-28 NOTE — Telephone Encounter (Signed)
 Patient called the office to let you know he had accutane  labs done with his lab work through infectious disease. Can you please review and advise if okay to start isotretinoin ?

## 2024-07-03 ENCOUNTER — Other Ambulatory Visit: Payer: Self-pay

## 2024-07-17 ENCOUNTER — Ambulatory Visit: Admitting: Dermatology

## 2024-07-23 ENCOUNTER — Other Ambulatory Visit: Payer: Self-pay

## 2024-07-23 ENCOUNTER — Other Ambulatory Visit: Payer: Self-pay | Admitting: Internal Medicine

## 2024-07-23 DIAGNOSIS — B20 Human immunodeficiency virus [HIV] disease: Secondary | ICD-10-CM

## 2024-07-23 MED ORDER — BIKTARVY 50-200-25 MG PO TABS
1.0000 | ORAL_TABLET | Freq: Every day | ORAL | 0 refills | Status: DC
  Filled 2024-07-23 – 2024-07-27 (×3): qty 30, 30d supply, fill #0

## 2024-07-24 ENCOUNTER — Other Ambulatory Visit: Payer: Self-pay

## 2024-07-25 ENCOUNTER — Other Ambulatory Visit: Payer: Self-pay

## 2024-07-26 ENCOUNTER — Other Ambulatory Visit (HOSPITAL_COMMUNITY): Payer: Self-pay

## 2024-07-27 ENCOUNTER — Other Ambulatory Visit: Payer: Self-pay

## 2024-07-27 ENCOUNTER — Other Ambulatory Visit (HOSPITAL_COMMUNITY): Payer: Self-pay

## 2024-07-27 NOTE — Progress Notes (Signed)
 Specialty Pharmacy Refill Coordination Note  Charles Dalton is a 48 y.o. male contacted today regarding refills of specialty medication(s) Bictegravir-Emtricitab-Tenofov (Biktarvy )   Patient requested Delivery   Delivery date: 07/31/24   Verified address: [address] [place] [address]   Medication will be filled on 07/27/24.

## 2024-08-07 ENCOUNTER — Encounter: Payer: Self-pay | Admitting: Internal Medicine

## 2024-08-14 ENCOUNTER — Encounter: Payer: Self-pay | Admitting: Dermatology

## 2024-08-14 ENCOUNTER — Ambulatory Visit (INDEPENDENT_AMBULATORY_CARE_PROVIDER_SITE_OTHER): Admitting: Dermatology

## 2024-08-14 VITALS — Wt 141.0 lb

## 2024-08-14 DIAGNOSIS — L732 Hidradenitis suppurativa: Secondary | ICD-10-CM

## 2024-08-14 DIAGNOSIS — L819 Disorder of pigmentation, unspecified: Secondary | ICD-10-CM | POA: Diagnosis not present

## 2024-08-14 DIAGNOSIS — L739 Follicular disorder, unspecified: Secondary | ICD-10-CM | POA: Diagnosis not present

## 2024-08-14 DIAGNOSIS — L700 Acne vulgaris: Secondary | ICD-10-CM

## 2024-08-14 DIAGNOSIS — L853 Xerosis cutis: Secondary | ICD-10-CM

## 2024-08-14 DIAGNOSIS — Z79899 Other long term (current) drug therapy: Secondary | ICD-10-CM

## 2024-08-14 DIAGNOSIS — Z7189 Other specified counseling: Secondary | ICD-10-CM

## 2024-08-14 MED ORDER — ISOTRETINOIN 30 MG PO CAPS: 30.0000 mg | ORAL_CAPSULE | Freq: Every day | ORAL | 0 refills | Status: AC

## 2024-08-14 NOTE — Progress Notes (Signed)
 Isotretinoin  Follow-Up Visit   Subjective  Charles Dalton is a 48 y.o. male who presents for the following: Isotretinoin  follow-up Isotretinoin  20mg  1 po qd, pt has one sleeve left  Week # 4   Isotretinoin  F/U - 08/14/24 1100       Isotretinoin  Follow Up   iPledge # [id number]    Date 08/14/24    Weight 141 lb (64 kg)    Acne breakouts since last visit? Yes      Dosage   Target Dosage (mg) 9600    Current (To Date) Dosage (mg) 600    To Go Dosage (mg) 9000      Skin Side Effects   Dry Lips Yes    Nose bleeds No    Dry eyes No    Dry Skin Yes    Sunburn No      Gastrointestinal Side Effects   Nausea No    Diarrhea Yes    Blood in stool No      Neurological Side Effects   Blurred vision No    Depression Yes   pt has had 3 family members pass since starting Isotretinoin    Headache No    Homicidal thoughts No    Mood Changes No    Suicidal thoughts No      Constitutional Side Effects   Fatigue No      Musculoskeletal Side Effects   Muscle aches No      Labs Notes   Last labs done 06/19/24           Side effects: Dry skin, dry lips  The following portions of the chart were reviewed this encounter and updated as appropriate: medications, allergies, medical history  Review of Systems:  No other skin or systemic complaints except as noted in HPI or Assessment and Plan.  Objective  Well appearing patient in no apparent distress; mood and affect are within normal limits.  An examination of the face, neck, chest, and back was performed and relevant findings are noted below.     Assessment & Plan     FOLLICULITIS, ACNE VULGARIS and HIDRADENITIS Patient is currently on Isotretinoin  requiring FDA mandated monthly evaluations and laboratory monitoring. Condition is currently not to goal (must reach target dose based on weight and also have clear skin for 2 months prior to discontinuation in order to help prevent relapse)  Exam findings:  paps and  hyperpigmented macules and crust trunk, few active areas Week # 4 [facility]acy [facility] [address] [place] # [id number]  Total mg -  600mg  Total mg/kg - 9.4mg /kg  Continue isotretinoin  20mg  1 po qd until finish last months prescription, then increase to Isotretinoin  30mg  1 po qd with fatty meal  Patient confirmed in iPledge and isotretinoin  sent to pharmacy.    Xerosis secondary to isotretinoin  therapy - Continue emollients as directed - Xyzal (levocetirizine) once a day and fish oil 1 gram daily may also help with dryness   Cheilitis secondary to isotretinoin  therapy - Continue lip balm as directed, Dr. Horald Cortibalm recommended   Long term medication management (isotretinoin ).  Patient is using long term (months to years) prescription medication to control their dermatologic condition.  These medications require periodic monitoring to evaluate for efficacy and side effects and may require periodic laboratory monitoring.  - While taking Isotretinoin  and for 30 days after you finish the medication, do not share pills, do not donate blood. It is very important that a women who could become  pregnant not take this medicine or get a blood transfusion with this medicine in it. Isotretinoin  is best absorbed when taken with a fatty meal. Isotretinoin  can make you sensitive to the sun. Daily careful sun protection including sunscreen SPF 30+ when outdoors is recommended.  Follow-up in 30 days.  I, Grayce Saunas, RMA, am acting as scribe for Alm Rhyme, MD .   Documentation: I have reviewed the above documentation for accuracy and completeness, and I agree with the above.  Alm Rhyme, MD

## 2024-08-14 NOTE — Patient Instructions (Signed)

## 2024-08-21 ENCOUNTER — Other Ambulatory Visit: Payer: Self-pay | Admitting: Internal Medicine

## 2024-08-21 ENCOUNTER — Other Ambulatory Visit: Payer: Self-pay

## 2024-08-21 DIAGNOSIS — B20 Human immunodeficiency virus [HIV] disease: Secondary | ICD-10-CM

## 2024-08-21 MED ORDER — BIKTARVY 50-200-25 MG PO TABS
1.0000 | ORAL_TABLET | Freq: Every day | ORAL | 5 refills | Status: AC
Start: 2024-08-21 — End: ?
  Filled 2024-08-21 – 2024-08-23 (×2): qty 30, 30d supply, fill #0

## 2024-08-23 ENCOUNTER — Other Ambulatory Visit (HOSPITAL_COMMUNITY): Payer: Self-pay

## 2024-09-03 ENCOUNTER — Telehealth: Payer: Self-pay

## 2024-09-03 NOTE — Telephone Encounter (Signed)
 Spoke to patient about rescheduling missed appointment with Dr. Overton, said he would call back with work schedule.

## 2024-09-06 ENCOUNTER — Ambulatory Visit: Payer: Self-pay | Admitting: Nurse Practitioner

## 2024-09-06 ENCOUNTER — Ambulatory Visit
Admission: EM | Admit: 2024-09-06 | Discharge: 2024-09-06 | Disposition: A | Discharge status: Home / Self Care | Attending: Family Medicine | Admitting: Family Medicine

## 2024-09-06 ENCOUNTER — Encounter: Payer: Self-pay | Admitting: Emergency Medicine

## 2024-09-06 DIAGNOSIS — I10 Essential (primary) hypertension: Secondary | ICD-10-CM

## 2024-09-06 DIAGNOSIS — M62830 Muscle spasm of back: Secondary | ICD-10-CM | POA: Diagnosis not present

## 2024-09-06 DIAGNOSIS — S39012A Strain of muscle, fascia and tendon of lower back, initial encounter: Secondary | ICD-10-CM

## 2024-09-06 MED ORDER — ATENOLOL 25 MG PO TABS: 12.5000 mg | ORAL_TABLET | Freq: Every day | ORAL | 2 refills | Status: AC

## 2024-09-06 MED ORDER — METHOCARBAMOL 500 MG PO TABS: 1000.0000 mg | ORAL_TABLET | Freq: Three times a day (TID) | ORAL | 0 refills | Status: AC | PRN

## 2024-09-06 MED ORDER — DEXAMETHASONE SOD PHOSPHATE PF 10 MG/ML IJ SOLN
10.0000 mg | Freq: Once | INTRAMUSCULAR | Status: AC
  Administered 2024-09-06: 10 mg via INTRAMUSCULAR

## 2024-09-06 NOTE — ED Triage Notes (Signed)
 Pt presents c/o back pain x 5 days. Pt reports he picked up a box at work when he heard a pop. Pt says the pain started the next day. Pt states the pain makes it difficult to walk and sit down.

## 2024-09-06 NOTE — Telephone Encounter (Signed)
 FYI Only or Action Required?: FYI only for provider.  Patient was last seen in primary care on 02/23/2023 by Hanford Powell BRAVO, NP.  Called Nurse Triage reporting Back Pain.  Symptoms began several days ago.  Interventions attempted: OTC medications: Tylenol  (not working per pt).  Symptoms are: unchanged.  Triage Disposition: See HCP Within 4 Hours (Or PCP Triage)  Patient/caregiver understands and will follow disposition?: Yes               Copied from CRM [id number]. Topic: Clinical - Red Word Triage >> Sep 06, 2024  9:10 AM Charlet HERO wrote: Red Word that prompted transfer to Nurse Triage: Patient is stating that he picked up a box at work a few days ago and now he is in severe pain . [facility] Reason for Disposition  [1] SEVERE back pain (e.g., excruciating, unable to do any normal activities) AND [2] not improved 2 hours after pain medicine  Answer Assessment - Initial Assessment Questions This RN recommends pt goes to urgent care today as no appointment availability in office. Pt agreeable.   Lower back pain after picking up a box at work a few days ago Pain is constant (8/10 pain level)   RADIATION: Does the pain shoot into your legs or somewhere else?     Denies  NEUROLOGIC SYMPTOMS: Do you have any weakness, numbness, or problems with bowel/bladder control?     Denies  OTHER SYMPTOMS: Do you have any other symptoms? (e.g., fever, abdomen pain, burning with urination, blood in urine)       Denies  Protocols used: Back Pain-A-AH

## 2024-09-06 NOTE — Discharge Instructions (Addendum)
 HOME CARE INSTRUCTIONS: For many people, back pain returns. Since low back pain is rarely dangerous, it is often a condition that people can learn to manage on their own. Please remain active. It is stressful on the back to sit or stand in one place. Do not sit, drive, or stand in one place for more than 30 minutes at a time. Take short walks on level surfaces as soon as pain allows. Try to increase the length of time you walk each day. Do not stay in bed. Resting more than 1 or 2 days can delay your recovery. Do not avoid exercise or work. Your body is made to move. It is not dangerous to be active, even though your back may hurt. Your back will likely heal faster if you return to being active before your pain is gone. Over-the-counter medicines to reduce pain and inflammation are often the most helpful.  SEEK MEDICAL CARE IF: You have pain that is not relieved with rest or medicine. You have pain that does not improve in 1 week. You have new symptoms. You are generally not feeling well.  SEEK IMMEDIATE MEDICAL CARE IF: You have pain that radiates from your back into your legs. You develop new bowel or bladder control problems. You have unusual weakness or numbness in your arms or legs. You develop nausea or vomiting. You develop abdominal pain. You feel faint.  Your blood pressure was noted to be elevated during your visit today. If you are currently taking medication for high blood pressure, please ensure you are taking this as directed. If you do not have a history of high blood pressure and your blood pressure remains persistently elevated, you may need to begin taking a medication at some point. You may return here within the next few days to recheck if unable to see your primary care provider or if you do not have a one.  BP (!) 192/126 (BP Location: Left Arm) Comment: Pt states he takes meds to maintain but has been out for a while. Pt has no PCP.  Pulse 76   Temp 98.1 F (36.7 C) (Oral)    Resp 16   Wt 64 kg   SpO2 98%   BMI 21.45 kg/m   BP Readings from Last 3 Encounters:  09/06/24 (!) 192/126  01/20/24 (!) 169/117  03/10/23 (!) 157/86

## 2024-09-06 NOTE — ED Provider Notes (Signed)
 [facility]   [id number] 09/06/24 Arrival Time: 9043  ASSESSMENT & PLAN:  1. Strain of lumbar region, initial encounter   2. Muscle spasm of back   3. Elevated blood pressure reading with diagnosis of hypertension   4. Essential hypertension    Able to ambulate here and hemodynamically stable. No indication for imaging of back at this time given no trauma and normal neurological exam.   Meds ordered this encounter  Medications   dexamethasone (DECADRON) injection 10 mg   atenolol  (TENORMIN ) 25 MG tablet    Sig: Take 0.5 tablets (12.5 mg total) by mouth at bedtime.    Dispense:  30 tablet    Refill:  2   methocarbamol (ROBAXIN) 500 MG tablet    Sig: Take 2 tablets (1,000 mg total) by mouth every 8 (eight) hours as needed for muscle spasms.    Dispense:  30 tablet    Refill:  0   Work/school excuse note: provided. Medication sedation precautions given. Encourage ROM/movement as tolerated.    Discharge Instructions      HOME CARE INSTRUCTIONS: For many people, back pain returns. Since low back pain is rarely dangerous, it is often a condition that people can learn to manage on their own. Please remain active. It is stressful on the back to sit or stand in one place. Do not sit, drive, or stand in one place for more than 30 minutes at a time. Take short walks on level surfaces as soon as pain allows. Try to increase the length of time you walk each day. Do not stay in bed. Resting more than 1 or 2 days can delay your recovery. Do not avoid exercise or work. Your body is made to move. It is not dangerous to be active, even though your back may hurt. Your back will likely heal faster if you return to being active before your pain is gone. Over-the-counter medicines to reduce pain and inflammation are often the most helpful.  SEEK MEDICAL CARE IF: You have pain that is not relieved with rest or medicine. You have pain that does not improve in 1 week. You have new  symptoms. You are generally not feeling well.  SEEK IMMEDIATE MEDICAL CARE IF: You have pain that radiates from your back into your legs. You develop new bowel or bladder control problems. You have unusual weakness or numbness in your arms or legs. You develop nausea or vomiting. You develop abdominal pain. You feel faint.  Your blood pressure was noted to be elevated during your visit today. If you are currently taking medication for high blood pressure, please ensure you are taking this as directed. If you do not have a history of high blood pressure and your blood pressure remains persistently elevated, you may need to begin taking a medication at some point. You may return here within the next few days to recheck if unable to see your primary care provider or if you do not have a one.  BP (!) 192/126 (BP Location: Left Arm) Comment: Pt states he takes meds to maintain but has been out for a while. Pt has no PCP.  Pulse 76   Temp 98.1 F (36.7 C) (Oral)   Resp 16   Wt 64 kg   SpO2 98%   BMI 21.45 kg/m   BP Readings from Last 3 Encounters:  09/06/24 (!) 192/126  01/20/24 (!) 169/117  03/10/23 (!) 157/86        Recommend:  Follow-up Information  Schedule an appointment as soon as possible for a visit  with Boscia, Heather E, NP.   Specialty: Oncology Why: For follow up and to recheck your blood pressure. Contact information: [address] [place] [address] [phone]                 Reviewed expectations re: course of current medical issues. Questions answered. Outlined signs and symptoms indicating need for more acute intervention. Patient verbalized understanding. After Visit Summary given.   SUBJECTIVE: History from: patient.  Charles Dalton is a 48 y.o. male who presents with complaint of back pain x 5 days. Pt reports he picked up a box at work when he heard a pop. Pt says the pain started the next day. Pt states the pain makes it  difficult to walk and sit down. Denies back trauma.  Increased blood pressure noted today. Reports that he is treated for HTN. Requests refill of HTN med.   OBJECTIVE:  Vitals:   09/06/24 1055 09/06/24 1056  BP:  (!) 192/126  Pulse:  76  Resp:  16  Temp:  98.1 F (36.7 C)  TempSrc:  Oral  SpO2:  98%  Weight: 64 kg     General appearance: alert; no distress HEENT: [place]; AT Neck: supple with FROM; without midline tenderness CV: regular Lungs: unlabored respirations; speaks full sentences without difficulty Abdomen: soft, non-tender; non-distended Back: moderate and poorly localized tenderness to palpation over lumbar paraspinal musculature bilaterally; FROM at waist; bruising: none; without midline tenderness Extremities: without edema; symmetrical without gross deformities; normal ROM of bilateral LE Skin: warm and dry Neurologic: normal gait; normal sensation and strength of bilateral LE Psychological: alert and cooperative; normal mood and affect  Labs:  Labs Reviewed - No data to display  Imaging: No results found.  No Known Allergies  Past Medical History:  Diagnosis Date   High blood pressure    HIV (human immunodeficiency virus infection) (HCC)    Social History   Socioeconomic History   Marital status: Single    Spouse name: Not on file   Number of children: Not on file   Years of education: Not on file   Highest education level: Not on file  Occupational History   Not on file  Tobacco Use   Smoking status: Every Day    Types: Cigarettes    Passive exposure: Current   Smokeless tobacco: Never  Vaping Use   Vaping status: Never Used  Substance and Sexual Activity   Alcohol use: Yes   Drug use: Not Currently   Sexual activity: Not Currently    Comment: declined condoms  Other Topics Concern   Not on file  Social History Narrative   Not on file   Social Drivers of Health   Financial Resource Strain: Not on file  Food Insecurity: Not on file   Transportation Needs: Not on file  Physical Activity: Not on file  Stress: Not on file  Social Connections: Not on file  Intimate Partner Violence: Not on file   Family History  Problem Relation Age of Onset   Prostate cancer Father    Prostate cancer Maternal Grandfather    History reviewed. No pertinent surgical history.    Rolinda Rogue, MD 09/06/24 1321

## 2024-09-25 ENCOUNTER — Ambulatory Visit: Admitting: Dermatology

## 2024-10-17 ENCOUNTER — Other Ambulatory Visit: Payer: Self-pay

## 2024-12-19 ENCOUNTER — Other Ambulatory Visit: Payer: Self-pay | Admitting: Pharmacist

## 2024-12-19 NOTE — Progress Notes (Signed)
 Patient has not filled rx with [facility] since August and has missed multiple appointments at [facility] despite reading MyChart messages. Will disenroll at this time.  Alan Geralds, PharmD, CPP, BCIDP, AAHIVP Clinical Pharmacist Practitioner Infectious Diseases Clinical Pharmacist [facility]
